# Patient Record
Sex: Male | Born: 1970 | Race: White | Hispanic: No | Marital: Married | Smoking: Never smoker
Health system: Southern US, Community
[De-identification: ages and names within clinical notes are randomized; demographics above are authoritative.]

## PROBLEM LIST (undated history)

## (undated) DIAGNOSIS — I499 Cardiac arrhythmia, unspecified: Secondary | ICD-10-CM

## (undated) DIAGNOSIS — R5383 Other fatigue: Secondary | ICD-10-CM

## (undated) DIAGNOSIS — R5381 Other malaise: Secondary | ICD-10-CM

## (undated) DIAGNOSIS — Q249 Congenital malformation of heart, unspecified: Secondary | ICD-10-CM

## (undated) DIAGNOSIS — M5380 Other specified dorsopathies, site unspecified: Secondary | ICD-10-CM

## (undated) DIAGNOSIS — R42 Dizziness and giddiness: Secondary | ICD-10-CM

## (undated) HISTORY — DX: Dizziness and giddiness: R42

## (undated) HISTORY — DX: Congenital malformation of heart, unspecified: Q24.9

## (undated) HISTORY — DX: Other specified dorsopathies, site unspecified: M53.80

## (undated) HISTORY — DX: Other malaise: R53.81

## (undated) HISTORY — DX: Other fatigue: R53.83

## (undated) HISTORY — DX: Cardiac arrhythmia, unspecified: I49.9

---

## 2010-11-20 ENCOUNTER — Ambulatory Visit: Payer: Self-pay | Admitting: Family Medicine

## 2011-07-22 ENCOUNTER — Ambulatory Visit: Payer: Self-pay | Admitting: Internal Medicine

## 2013-03-04 ENCOUNTER — Ambulatory Visit: Payer: Self-pay | Admitting: Emergency Medicine

## 2013-03-04 LAB — URINALYSIS, COMPLETE
Blood: NEGATIVE
Glucose,UR: NEGATIVE mg/dL (ref 0–75)
Ketone: NEGATIVE
Leukocyte Esterase: NEGATIVE
Nitrite: NEGATIVE
Protein: NEGATIVE
Specific Gravity: 1.01 (ref 1.003–1.030)
WBC UR: NONE SEEN /HPF (ref 0–5)

## 2013-12-19 ENCOUNTER — Ambulatory Visit: Payer: Self-pay

## 2013-12-19 LAB — CBC WITH DIFFERENTIAL/PLATELET
BASOS ABS: 0.1 10*3/uL (ref 0.0–0.1)
Basophil %: 0.9 %
Eosinophil #: 0.1 10*3/uL (ref 0.0–0.7)
Eosinophil %: 1.4 %
HCT: 41.4 % (ref 40.0–52.0)
HGB: 14.2 g/dL (ref 13.0–18.0)
Lymphocyte #: 2.4 10*3/uL (ref 1.0–3.6)
Lymphocyte %: 31 %
MCH: 29.2 pg (ref 26.0–34.0)
MCHC: 34.3 g/dL (ref 32.0–36.0)
MCV: 85 fL (ref 80–100)
Monocyte #: 0.6 x10 3/mm (ref 0.2–1.0)
Monocyte %: 8.5 %
NEUTROS PCT: 58.2 %
Neutrophil #: 4.4 10*3/uL (ref 1.4–6.5)
Platelet: 256 10*3/uL (ref 150–440)
RBC: 4.85 10*6/uL (ref 4.40–5.90)
RDW: 13 % (ref 11.5–14.5)
WBC: 7.6 10*3/uL (ref 3.8–10.6)

## 2013-12-19 LAB — COMPREHENSIVE METABOLIC PANEL
ALBUMIN: 3.7 g/dL (ref 3.4–5.0)
ALT: 26 U/L
Alkaline Phosphatase: 47 U/L
Anion Gap: 7 (ref 7–16)
BUN: 17 mg/dL (ref 7–18)
Bilirubin,Total: 0.6 mg/dL (ref 0.2–1.0)
Calcium, Total: 8.9 mg/dL (ref 8.5–10.1)
Chloride: 104 mmol/L (ref 98–107)
Co2: 30 mmol/L (ref 21–32)
Creatinine: 1.04 mg/dL (ref 0.60–1.30)
Glucose: 79 mg/dL (ref 65–99)
Osmolality: 282 (ref 275–301)
POTASSIUM: 3.9 mmol/L (ref 3.5–5.1)
SGOT(AST): 16 U/L (ref 15–37)
SODIUM: 141 mmol/L (ref 136–145)
Total Protein: 7.2 g/dL (ref 6.4–8.2)

## 2013-12-19 LAB — MAGNESIUM: MAGNESIUM: 2 mg/dL

## 2013-12-19 LAB — TSH: THYROID STIMULATING HORM: 1.52 u[IU]/mL

## 2013-12-23 ENCOUNTER — Encounter: Payer: Self-pay | Admitting: *Deleted

## 2013-12-30 ENCOUNTER — Encounter: Payer: Self-pay | Admitting: *Deleted

## 2014-01-02 ENCOUNTER — Encounter: Payer: Self-pay | Admitting: Cardiovascular Disease

## 2014-01-02 ENCOUNTER — Encounter (INDEPENDENT_AMBULATORY_CARE_PROVIDER_SITE_OTHER): Payer: Self-pay

## 2014-01-02 ENCOUNTER — Ambulatory Visit (INDEPENDENT_AMBULATORY_CARE_PROVIDER_SITE_OTHER): Payer: BC Managed Care – PPO | Admitting: Cardiovascular Disease

## 2014-01-02 VITALS — BP 122/82 | HR 73 | Ht 68.0 in | Wt 180.5 lb

## 2014-01-02 DIAGNOSIS — R002 Palpitations: Secondary | ICD-10-CM | POA: Diagnosis not present

## 2014-01-02 NOTE — Patient Instructions (Signed)
Your physician has recommended that you wear a holter monitor. Holter monitors are medical devices that record the heart's electrical activity. Doctors most often use these monitors to diagnose arrhythmias. Arrhythmias are problems with the speed or rhythm of the heartbeat. The monitor is a small, portable device. You can wear one while you do your normal daily activities. This is usually used to diagnose what is causing palpitations/syncope (passing out).   Your physician recommends that you schedule a follow-up appointment in:  As needed    

## 2014-01-02 NOTE — Progress Notes (Signed)
  Primary care physician: Dr. Juanetta GoslingHawkins  HPI  This is a 43 year old man who was referred for evaluation of palpitations. He has no previous cardiac history. He has no significant chronic medical conditions. There is no family history of coronary artery disease or arrhythmia. He does not smoke. He consumes only one to 2 cups of coffee a day. He reports prolonged history of almost daily palpitations which got worse one week ago. He went to urgent care for that reason. He feels forceful heartbeats with fluttering. There has been no chest pain, shortness of breath, syncope or presyncope. He occasionally has very mild dizziness with the symptoms. His labs at the urgent care were unremarkable including normal thyroid function. ECG showed no acute abnormalities.  No Known Allergies   No current outpatient prescriptions on file prior to visit.   No current facility-administered medications on file prior to visit.     Past Medical History  Diagnosis Date  . Cardiac dysrhythmia, unspecified   . Unspecified congenital anomaly of heart   . Other malaise and fatigue   . Dizziness and giddiness   . Other symptoms referable to back      History reviewed. No pertinent past surgical history.   Family History  Problem Relation Age of Onset  . Hyperlipidemia Mother   . Hyperlipidemia Father      History   Social History  . Marital Status: Married    Spouse Name: N/A    Number of Children: N/A  . Years of Education: N/A   Occupational History  . Not on file.   Social History Main Topics  . Smoking status: Never Smoker   . Smokeless tobacco: Not on file  . Alcohol Use: No  . Drug Use: No  . Sexual Activity: Not on file   Other Topics Concern  . Not on file   Social History Narrative  . No narrative on file     ROS A 10 point review of system was performed. It is negative other than that mentioned in the history of present illness.   PHYSICAL EXAM   BP 122/82  Pulse 73   Ht 5\' 8"  (1.727 m)  Wt 180 lb 8 oz (81.874 kg)  BMI 27.45 kg/m2 Constitutional: He is oriented to person, place, and time. He appears well-developed and well-nourished. No distress.  HENT: No nasal discharge.  Head: Normocephalic and atraumatic.  Eyes: Pupils are equal and round.  No discharge. Neck: Normal range of motion. Neck supple. No JVD present. No thyromegaly present.  Cardiovascular: Normal rate, regular rhythm, normal heart sounds. Exam reveals no gallop and no friction rub. No murmur heard.  Pulmonary/Chest: Effort normal and breath sounds normal. No stridor. No respiratory distress. He has no wheezes. He has no rales. He exhibits no tenderness.  Abdominal: Soft. Bowel sounds are normal. He exhibits no distension. There is no tenderness. There is no rebound and no guarding.  Musculoskeletal: Normal range of motion. He exhibits no edema and no tenderness.  Neurological: He is alert and oriented to person, place, and time. Coordination normal.  Skin: Skin is warm and dry. No rash noted. He is not diaphoretic. No erythema. No pallor.  Psychiatric: He has a normal mood and affect. His behavior is normal. Judgment and thought content normal.       WUJ:[id number]:Sinus  Rhythm  WITHIN NORMAL LIMITS   ASSESSMENT AND PLAN

## 2014-01-03 NOTE — Assessment & Plan Note (Signed)
These are likely due to premature beats. The symptoms are overall mild. Physical exam is unremarkable and baseline ECG is normal. There are no red flags for malignant arrhythmia. I recommend evaluation with a 48-hour Holter monitor. He is already cutting down on caffeine intake.

## 2014-01-27 ENCOUNTER — Telehealth: Payer: Self-pay | Admitting: *Deleted

## 2014-01-27 NOTE — Telephone Encounter (Signed)
Informed patient that per Dr. Kirke Corin holter showed: Normal sinus rhythm with sinus arrythmia  Rare pac's  No significant arrythmia's Patient verbalized understanding

## 2014-01-31 ENCOUNTER — Other Ambulatory Visit: Payer: Self-pay

## 2014-01-31 ENCOUNTER — Ambulatory Visit (INDEPENDENT_AMBULATORY_CARE_PROVIDER_SITE_OTHER): Payer: BC Managed Care – PPO

## 2014-01-31 DIAGNOSIS — R002 Palpitations: Secondary | ICD-10-CM

## 2014-10-15 DIAGNOSIS — I459 Conduction disorder, unspecified: Secondary | ICD-10-CM | POA: Insufficient documentation

## 2014-10-15 DIAGNOSIS — R5381 Other malaise: Secondary | ICD-10-CM | POA: Insufficient documentation

## 2014-10-15 DIAGNOSIS — R42 Dizziness and giddiness: Secondary | ICD-10-CM | POA: Insufficient documentation

## 2014-10-15 DIAGNOSIS — M62830 Muscle spasm of back: Secondary | ICD-10-CM | POA: Insufficient documentation

## 2014-10-15 DIAGNOSIS — Z8619 Personal history of other infectious and parasitic diseases: Secondary | ICD-10-CM | POA: Insufficient documentation

## 2014-10-15 DIAGNOSIS — I519 Heart disease, unspecified: Secondary | ICD-10-CM | POA: Insufficient documentation

## 2014-10-15 DIAGNOSIS — R5383 Other fatigue: Secondary | ICD-10-CM

## 2014-10-18 ENCOUNTER — Ambulatory Visit (INDEPENDENT_AMBULATORY_CARE_PROVIDER_SITE_OTHER): Payer: BLUE CROSS/BLUE SHIELD | Admitting: Family Medicine

## 2014-10-18 ENCOUNTER — Encounter: Payer: Self-pay | Admitting: Family Medicine

## 2014-10-18 VITALS — BP 129/83 | HR 67 | Temp 98.7°F | Resp 16 | Ht 69.0 in | Wt 171.8 lb

## 2014-10-18 DIAGNOSIS — Z0000 Encounter for general adult medical examination without abnormal findings: Secondary | ICD-10-CM | POA: Diagnosis not present

## 2014-10-18 DIAGNOSIS — Z23 Encounter for immunization: Secondary | ICD-10-CM | POA: Diagnosis not present

## 2014-10-18 NOTE — Addendum Note (Signed)
Addended by: Clayborne ArtistHOLDEN, JAMIE A on: 10/18/2014 04:09 PM   Modules accepted: Kipp BroodSmartSet

## 2014-10-18 NOTE — Patient Instructions (Signed)
Form for [employer] completed and signed.

## 2014-10-18 NOTE — Progress Notes (Signed)
Name: John Christensen   MRN: [id number]    DOB: 1971/03/09   Date:10/18/2014       Progress Note  Subjective  Chief Complaint  Chief Complaint  Patient presents with  . Annual Exam    HPI-Here for [place] physical exam.  HE has no major complaints.  No current medical problems of significance.   No problem-specific assessment & plan notes found for this encounter.   Past Medical History  Diagnosis Date  . Cardiac dysrhythmia, unspecified   . Unspecified congenital anomaly of heart   . Other malaise and fatigue   . Dizziness and giddiness   . Other symptoms referable to back     History reviewed. No pertinent past surgical history.  Family History  Problem Relation Age of Onset  . Hyperlipidemia Mother   . Hyperlipidemia Father     History   Social History  . Marital Status: Married    Spouse Name: N/A  . Number of Children: N/A  . Years of Education: N/A   Occupational History  . Not on file.   Social History Main Topics  . Smoking status: Never Smoker   . Smokeless tobacco: Never Used  . Alcohol Use: No  . Drug Use: No  . Sexual Activity: Not on file   Other Topics Concern  . Not on file   Social History Narrative    No current outpatient prescriptions on file.  No Known Allergies   Review of Systems  Constitutional: Negative.   HENT: Negative.   Eyes: Negative.   Respiratory: Negative.   Cardiovascular: Negative.  Negative for palpitations.  Gastrointestinal: Negative.   Genitourinary: Negative.   Musculoskeletal: Negative for back pain (on and off; minor).  Skin: Negative.   Neurological: Negative.   Endo/Heme/Allergies: Negative.  Negative for environmental allergies.  Psychiatric/Behavioral: Negative.       Objective  Filed Vitals:   10/18/14 1406  BP: 129/83  Pulse: 67  Temp: 98.7 F (37.1 C)  Resp: 16  Height: _0  (1.753 m)  Weight: 171 lb 12.8 oz (77.928 kg)    Physical Exam  Constitutional: He is  oriented to person, place, and time and well-developed, well-nourished, and in no distress. No distress.  HENT:  Head: Normocephalic and atraumatic.  Right Ear: External ear normal.  Left Ear: External ear normal.  Nose: Nose normal.  Mouth/Throat: Oropharynx is clear and moist.  Eyes: Conjunctivae are normal. Pupils are equal, round, and reactive to light. Right eye exhibits no discharge. Left eye exhibits no discharge. No scleral icterus.  Neck: Normal range of motion. Neck supple. No tracheal deviation present. No thyromegaly present.  Cardiovascular: Normal rate, regular rhythm, normal heart sounds and intact distal pulses.  Exam reveals no gallop and no friction rub.   No murmur heard. Pulmonary/Chest: Effort normal and breath sounds normal. He has no wheezes. He has no rales. He exhibits no tenderness.  Abdominal: Soft. Bowel sounds are normal. He exhibits no distension and no mass. There is no tenderness. There is no rebound and no guarding.  Genitourinary: Penis normal. No discharge found.  Musculoskeletal: Normal range of motion. He exhibits no edema or tenderness.  Lymphadenopathy:    He has no cervical adenopathy.  Neurological: He is alert and oriented to person, place, and time. He has normal reflexes. No cranial nerve deficit. Coordination normal.  Skin: Skin is warm and dry. No rash noted. No erythema. No pallor.  Psychiatric: Mood, affect and judgment normal.  Vitals  reviewed.      No results found for this or any previous visit (from the past 2160 hour(s)).   Assessment & Plan  Problem List Items Addressed This Visit    None    Visit Diagnoses    Preventative health care    -  Primary    Relevant Orders    CBC w/Diff    Comp Met (CMET)    Lipid Profile    Need for Tdap vaccination        Relevant Orders    Tdap vaccine greater than or equal to 7yo IM (Completed)       No orders of the defined types were placed in this encounter.    Form for [employer]  physical completed and signed.

## 2014-10-19 ENCOUNTER — Encounter: Payer: Self-pay | Admitting: Family Medicine

## 2014-10-19 LAB — COMPREHENSIVE METABOLIC PANEL
ALBUMIN: 4.9 g/dL (ref 3.5–5.5)
ALT: 23 IU/L (ref 0–44)
AST: 22 IU/L (ref 0–40)
Albumin/Globulin Ratio: 2 (ref 1.1–2.5)
Alkaline Phosphatase: 52 IU/L (ref 39–117)
BUN / CREAT RATIO: 14 (ref 9–20)
BUN: 15 mg/dL (ref 6–24)
Bilirubin Total: 0.9 mg/dL (ref 0.0–1.2)
CALCIUM: 9.9 mg/dL (ref 8.7–10.2)
CHLORIDE: 102 mmol/L (ref 97–108)
CO2: 25 mmol/L (ref 18–29)
Creatinine, Ser: 1.05 mg/dL (ref 0.76–1.27)
GFR calc Af Amer: 99 mL/min/{1.73_m2} (ref >59)
GFR calc non Af Amer: 85 mL/min/{1.73_m2} (ref >59)
GLOBULIN, TOTAL: 2.5 g/dL (ref 1.5–4.5)
Glucose: 75 mg/dL (ref 65–99)
POTASSIUM: 4.3 mmol/L (ref 3.5–5.2)
Sodium: 143 mmol/L (ref 134–144)
TOTAL PROTEIN: 7.4 g/dL (ref 6.0–8.5)

## 2014-10-19 LAB — CBC WITH DIFFERENTIAL/PLATELET
BASOS: 1 %
Basophils Absolute: 0.1 10*3/uL (ref 0.0–0.2)
EOS (ABSOLUTE): 0.1 10*3/uL (ref 0.0–0.4)
EOS: 1 %
HEMATOCRIT: 44 % (ref 37.5–51.0)
Hemoglobin: 15.1 g/dL (ref 12.6–17.7)
IMMATURE GRANULOCYTES: 0 %
Immature Grans (Abs): 0 10*3/uL (ref 0.0–0.1)
Lymphocytes Absolute: 2.4 10*3/uL (ref 0.7–3.1)
Lymphs: 28 %
MCH: 28.9 pg (ref 26.6–33.0)
MCHC: 34.3 g/dL (ref 31.5–35.7)
MCV: 84 fL (ref 79–97)
MONOCYTES: 8 %
Monocytes Absolute: 0.7 10*3/uL (ref 0.1–0.9)
Neutrophils Absolute: 5.4 10*3/uL (ref 1.4–7.0)
Neutrophils: 62 %
Platelets: 308 10*3/uL (ref 150–379)
RBC: 5.23 x10E6/uL (ref 4.14–5.80)
RDW: 13.2 % (ref 12.3–15.4)
WBC: 8.6 10*3/uL (ref 3.4–10.8)

## 2014-10-19 LAB — LIPID PANEL
CHOL/HDL RATIO: 2.9 ratio (ref 0.0–5.0)
CHOLESTEROL TOTAL: 174 mg/dL (ref 100–199)
HDL: 59 mg/dL (ref >39)
LDL Calculated: 105 mg/dL — ABNORMAL HIGH (ref 0–99)
TRIGLYCERIDES: 51 mg/dL (ref 0–149)
VLDL Cholesterol Cal: 10 mg/dL (ref 5–40)

## 2014-10-19 NOTE — Telephone Encounter (Signed)
This encounter was created in error - please disregard.

## 2015-03-28 ENCOUNTER — Ambulatory Visit (INDEPENDENT_AMBULATORY_CARE_PROVIDER_SITE_OTHER): Payer: BLUE CROSS/BLUE SHIELD

## 2015-03-28 ENCOUNTER — Encounter: Payer: Self-pay | Admitting: Emergency Medicine

## 2015-03-28 ENCOUNTER — Ambulatory Visit
Admission: EM | Admit: 2015-03-28 | Discharge: 2015-03-28 | Disposition: A | Discharge status: Home / Self Care | Payer: BLUE CROSS/BLUE SHIELD | Attending: Family Medicine | Admitting: Family Medicine

## 2015-03-28 DIAGNOSIS — S8262XA Displaced fracture of lateral malleolus of left fibula, initial encounter for closed fracture: Secondary | ICD-10-CM | POA: Diagnosis not present

## 2015-03-28 NOTE — ED Provider Notes (Signed)
CSN: [id number]     Arrival date & time 03/28/15  0831 History   First MD Initiated Contact with Patient 03/28/15 [phone]     Chief Complaint  Patient presents with  . 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 He was [place] [facility] [place] diagnosis with a sprain x-rays reportedly showed no fracture is given an Ace wrap and told to use crutches that he had at home. Out of work until Monday but his boss sent him home because he was limping so badly. He states that the pain has persisted in some kind somewhat worse along with the swelling and bruising that he is has around his inferior ankle.  Past Medical History  Diagnosis Date  . Cardiac dysrhythmia, unspecified   . Unspecified congenital anomaly of heart   . Other malaise and fatigue   . Dizziness and giddiness   . Other symptoms referable to back    History reviewed. No pertinent past surgical history. Family History  Problem Relation Age of Onset  . Hyperlipidemia Mother   . Hyperlipidemia Father    Social History  Substance Use Topics  . Smoking status: Never Smoker   . Smokeless tobacco: Never Used  . Alcohol Use: No    Review of Systems  Constitutional: Positive for activity change. Negative for fever, chills and fatigue.  Musculoskeletal: Positive for joint swelling, arthralgias and gait problem.  Skin: Positive for color change.  All other systems reviewed and are negative.   Allergies  Review of patient's allergies indicates no known allergies.  Home Medications   Prior to Admission medications   Not on File   Meds Ordered and Administered this Visit  Medications - No data to display  BP 117/70 mmHg  Pulse 66  Temp(Src) 97.8 F (36.6 C) (Tympanic)  Resp 20  Ht 5' 8.5" (1.74 m)  Wt 170  lb (77.111 kg)  BMI 25.47 kg/m2  SpO2 100% No data found.   Physical Exam  Constitutional: He is oriented to person, place, and time. He appears well-developed and well-nourished. No distress.  HENT:  Head: Normocephalic and atraumatic.  Eyes: Pupils are equal, round, and reactive to light.  Neck: Neck supple.  Musculoskeletal: He exhibits edema and tenderness.  Admission of the left ankle shows limited range of motion to flexion extension but subtalar motion is preserved. Swelling mostly laterally with ecchymosis inferiorly along the plantar surface. He has no medial tenderness and most of his tenderness seems to be confined to the distal fibula lateral malleolus. He doesn't have much ligamentous pain he does have some slight tenderness over the base of the fifth metatarsal but this is not nearly as bad as the distal fibula.  Neurological: He is alert and oriented to person, place, and time.  Skin: Skin is warm and dry. He is not diaphoretic.  Psychiatric: He has a normal mood and affect. His behavior is normal. Judgment and thought content normal.  Nursing note and vitals reviewed.   ED Course  Procedures (including critical care time)  Labs Review Labs Reviewed - No data to display  Imaging Review Dg Ankle Complete Left  03/28/2015  CLINICAL DATA:  Stepped in hole twisting ankle with pain laterally EXAM: LEFT ANKLE COMPLETE - 3+ VIEW COMPARISON:  None. FINDINGS: There is a tiny avulsion fracture fragment  from the tip of the distal left fibula with adjacent soft tissue swelling. The ankle joint appears normal. Alignment is normal. IMPRESSION: Small avulsion fracture fragment from the tip of the distal left fibula. Electronically Signed   By: Dwyane Dee M.D.   On: 03/28/2015 10:25     Visual Acuity Review  Right Eye Distance:   Left Eye Distance:   Bilateral Distance:    Right Eye Near:   Left Eye Near:    Bilateral Near:         MDM   1. Avulsion fracture of lateral  malleolus of left fibula, closed, initial encounter    New Prescriptions   No medications on file  Plan: 1. Test/x-ray results and diagnosis reviewed with patient 2. rx as per orders; risks, benefits, potential side effects reviewed with patient 3. Recommend supportive treatment with elevation/ice PRN. Boot applied here .May be out of boot for quiet periods.Crutches PRN. 4. F/u  Podiatry if not healing in 1-2 weeks.     Lutricia Feil, PA-C 03/28/15 1103

## 2015-03-28 NOTE — Discharge Instructions (Signed)
°Cast or Splint Care  ° ° °Casts and splints support injured limbs and keep bones from moving while they heal. It is important to care for your cast or splint at home.  °HOME CARE INSTRUCTIONS  °Keep the cast or splint uncovered during the drying period. It can take 24 to 48 hours to dry if it is made of plaster. A fiberglass cast will dry in less than 1 hour.  °Do not rest the cast on anything harder than a pillow for the first 24 hours.  °Do not put weight on your injured limb or apply pressure to the cast until your health care provider gives you permission.  °Keep the cast or splint dry. Wet casts or splints can lose their shape and may not support the limb as well. A wet cast that has lost its shape can also create harmful pressure on your skin when it dries. Also, wet skin can become infected.  °Cover the cast or splint with a plastic bag when bathing or when out in the rain or snow. If the cast is on the trunk of the body, take sponge baths until the cast is removed.  °If your cast does become wet, dry it with a towel or a blow dryer on the cool setting only. °Keep your cast or splint clean. Soiled casts may be wiped with a moistened cloth.  °Do not place any hard or soft foreign objects under your cast or splint, such as cotton, toilet paper, lotion, or powder.  °Do not try to scratch the skin under the cast with any object. The object could get stuck inside the cast. Also, scratching could lead to an infection. If itching is a problem, use a blow dryer on a cool setting to relieve discomfort.  °Do not trim or cut your cast or remove padding from inside of it.  °Exercise all joints next to the injury that are not immobilized by the cast or splint. For example, if you have a long leg cast, exercise the hip joint and toes. If you have an arm cast or splint, exercise the shoulder, elbow, thumb, and fingers.  °Elevate your injured arm or leg on 1 or 2 pillows for the first 1 to 3 days to decrease swelling and  pain. It is best if you can comfortably elevate your cast so it is higher than your heart. °SEEK MEDICAL CARE IF:  °Your cast or splint cracks.  °Your cast or splint is too tight or too loose.  °You have unbearable itching inside the cast.  °Your cast becomes wet or develops a soft spot or area.  °You have a bad smell coming from inside your cast.  °You get an object stuck under your cast.  °Your skin around the cast becomes red or raw.  °You have new pain or worsening pain after the cast has been applied. °SEEK IMMEDIATE MEDICAL CARE IF:  °You have fluid leaking through the cast.  °You are unable to move your fingers or toes.  °You have discolored (blue or white), cool, painful, or very swollen fingers or toes beyond the cast.  °You have tingling or numbness around the injured area.  °You have severe pain or pressure under the cast.  °You have any difficulty with your breathing or have shortness of breath.  °You have chest pain. °This information is not intended to replace advice given to you by your health care provider. Make sure you discuss any questions you have with your health care provider.  °  Document Released: 05/02/2000 Document Revised: 02/23/2013 Document Reviewed: 11/11/2012  °Elsevier Interactive Patient Education ©2016 [employer]  ° °

## 2015-03-28 NOTE — ED Notes (Signed)
Recheck left ankle.

## 2015-10-29 DIAGNOSIS — R0602 Shortness of breath: Secondary | ICD-10-CM | POA: Diagnosis not present

## 2015-11-19 DIAGNOSIS — R0789 Other chest pain: Secondary | ICD-10-CM | POA: Diagnosis not present

## 2015-11-19 DIAGNOSIS — R0609 Other forms of dyspnea: Secondary | ICD-10-CM | POA: Diagnosis not present

## 2015-11-19 DIAGNOSIS — R079 Chest pain, unspecified: Secondary | ICD-10-CM | POA: Diagnosis not present

## 2016-09-09 DIAGNOSIS — Z0000 Encounter for general adult medical examination without abnormal findings: Secondary | ICD-10-CM | POA: Diagnosis not present

## 2016-09-09 LAB — CBC WITH DIFFERENTIAL/PLATELET
Basophils Absolute: 52 cells/uL (ref 0–200)
Basophils Relative: 1 %
Eosinophils Absolute: 104 cells/uL (ref 15–500)
Eosinophils Relative: 2 %
HCT: 45.8 % (ref 38.5–50.0)
Hemoglobin: 15.7 g/dL (ref 13.2–17.1)
Lymphocytes Relative: 30 %
Lymphs Abs: 1560 cells/uL (ref 850–3900)
MCH: 29.1 pg (ref 27.0–33.0)
MCHC: 34.3 g/dL (ref 32.0–36.0)
MCV: 85 fL (ref 80.0–100.0)
MPV: 9.6 fL (ref 7.5–12.5)
Monocytes Absolute: 624 cells/uL (ref 200–950)
Monocytes Relative: 12 %
Neutro Abs: 2860 cells/uL (ref 1500–7800)
Neutrophils Relative %: 55 %
Platelets: 276 10*3/uL (ref 140–400)
RBC: 5.39 MIL/uL (ref 4.20–5.80)
RDW: 13.4 % (ref 11.0–15.0)
WBC: 5.2 10*3/uL (ref 3.8–10.8)

## 2016-09-09 LAB — TSH: TSH: 1.32 mIU/L (ref 0.40–4.50)

## 2016-09-10 LAB — LIPID PANEL
Cholesterol: 157 mg/dL (ref <200)
HDL: 63 mg/dL (ref >40)
LDL Cholesterol: 85 mg/dL (ref <100)
Total CHOL/HDL Ratio: 2.5 Ratio (ref <5.0)
Triglycerides: 47 mg/dL (ref <150)
VLDL: 9 mg/dL (ref <30)

## 2016-09-10 LAB — COMPREHENSIVE METABOLIC PANEL
ALT: 20 U/L (ref 9–46)
AST: 20 U/L (ref 10–40)
Albumin: 4.4 g/dL (ref 3.6–5.1)
Alkaline Phosphatase: 44 U/L (ref 40–115)
BUN: 19 mg/dL (ref 7–25)
CO2: 25 mmol/L (ref 20–31)
Calcium: 9.8 mg/dL (ref 8.6–10.3)
Chloride: 103 mmol/L (ref 98–110)
Creat: 1.22 mg/dL (ref 0.60–1.35)
Glucose, Bld: 81 mg/dL (ref 65–99)
Potassium: 4.3 mmol/L (ref 3.5–5.3)
Sodium: 139 mmol/L (ref 135–146)
Total Bilirubin: 1.4 mg/dL — ABNORMAL HIGH (ref 0.2–1.2)
Total Protein: 7.2 g/dL (ref 6.1–8.1)

## 2016-09-11 ENCOUNTER — Ambulatory Visit (INDEPENDENT_AMBULATORY_CARE_PROVIDER_SITE_OTHER): Payer: BLUE CROSS/BLUE SHIELD | Admitting: Nurse Practitioner

## 2016-09-11 ENCOUNTER — Other Ambulatory Visit: Payer: Self-pay | Admitting: Nurse Practitioner

## 2016-09-11 ENCOUNTER — Encounter: Payer: Self-pay | Admitting: Nurse Practitioner

## 2016-09-11 VITALS — BP 111/89 | HR 73 | Temp 98.4°F | Resp 16 | Ht 69.0 in | Wt 176.0 lb

## 2016-09-11 DIAGNOSIS — Z0000 Encounter for general adult medical examination without abnormal findings: Secondary | ICD-10-CM | POA: Diagnosis not present

## 2016-09-11 DIAGNOSIS — R0602 Shortness of breath: Secondary | ICD-10-CM | POA: Diagnosis not present

## 2016-09-11 DIAGNOSIS — Z87898 Personal history of other specified conditions: Secondary | ICD-10-CM | POA: Diagnosis not present

## 2016-09-11 LAB — HIV ANTIBODY (ROUTINE TESTING W REFLEX)

## 2016-09-11 NOTE — Assessment & Plan Note (Signed)
Currently stable symptoms with most recent occurrence in November.  Plan: 1. Monitor occurrences for any pattern or possible trigger. 2. Consider Singulair vs. Albuterol inhaler or spirometry to evaluate for asthma.

## 2016-09-11 NOTE — Patient Instructions (Signed)
John Christensen, Thank you for coming in to clinic today.  1. Your labs were normal without any concerns.  You can see these results on MyChart. 2. For your shortness of breath, monitor for any pattern of activity level or other triggers.  Let us know if you continue having episodes of shortness of breath with or without an identifiable trigger and we can see you in the office.  We can consider an allergy pill or other medication for known triggers or provide an inhaler for managing your symptoms.  Please schedule a follow-up appointment with Wilhelmina Mcardle, AGNP in  1 year for annual physical.  If you have any other questions or concerns, please feel free to call the clinic or send a message through MyChart. You may also schedule an earlier appointment if necessary.  Wilhelmina Mcardle, DNP, AGNP-BC Adult Gerontology Nurse Practitioner [facility], [facility]

## 2016-09-11 NOTE — Assessment & Plan Note (Signed)
No active palpitations  Plan:  1. Assess risk of cardiovascular co-morbidities with labs for TSH, CBC diff, CMP, and Lipid panel.

## 2016-09-11 NOTE — Progress Notes (Signed)
Subjective:    Patient ID: John Christensen, male    DOB: 19-May-1971, 46 y.o.   MRN: [id number]  John Christensen is a 46 y.o. male presenting on 09/11/2016 for Annual Exam (Patient here today for CPE and to have [employer] form filled out. Patient reports feeling fairly well. Reports exercising daily and sleeping failrly well. Patient reports that he has been having some shortness of breath on and off for several months. 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  Now occurs occasionally without pattern.  He feels like he can't get the air in and is gasping for breath (once with sex, swimming once, occasionally on the job, and once while outdoors on a hiking/camping trip in November. Since that trip he has also hiked 3 miles in/out [place] without incident.  There is no seeming pattern between activity level or environmental stimulus.   When it occurs it is always with strenuous activity, but does not occur every time the patient participates in strenuous activity.  Patient is able to slow his breathing and focus on moving air in and out and will recover within several minutes.  Health Maintenance Last eye:  More than 1 year less than 3 years. Last dental: about 1 year. 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  Diet: he has made some dietary adjustments after his last lipid panel.  He has changed the type of snack bar he eats and is now eating nut bars without a coating.  He eats fruit and sunflower seeds or other nuts for snacks regularly.  Past Medical History:  Diagnosis Date  . Cardiac dysrhythmia, unspecified   .  Dizziness and giddiness   . Other malaise and fatigue   . Other symptoms referable to back   . Unspecified congenital anomaly of heart    History reviewed. No pertinent surgical history. Social History   Social History  . Marital status: Married    Spouse name: N/A  . Number of children: N/A  . Years of education: N/A   Occupational History  . Not on file.   Social History Main Topics  . Smoking status: Never Smoker  . Smokeless tobacco: Never Used  . Alcohol use No  . Drug use: No  . Sexual activity: Not on file   Other Topics Concern  . Not on file   Social History Narrative  . No narrative on file   Family History  Problem Relation Age of Onset  . Hyperlipidemia Mother   . Hyperlipidemia Father    No current outpatient prescriptions on file prior to visit.   No current facility-administered medications on file prior to visit.     Review of Systems  Constitutional: Negative.   HENT: Negative.   Eyes: Negative.   Respiratory: Positive for shortness of breath. Negative for apnea, cough, choking, chest tightness, wheezing and stridor.   Cardiovascular: Negative.   Gastrointestinal: Negative.   Endocrine: Negative.   Genitourinary: Negative.   Musculoskeletal: Negative.   Skin: Negative.   Allergic/Immunologic: Negative.   Neurological: Negative.   Hematological: Negative.  Psychiatric/Behavioral: Negative.    Per HPI unless specifically indicated above     Objective:    BP 111/89 (BP Location: Left Arm, Patient Position: Sitting, Cuff Size: Large)   Pulse 73   Temp 98.4 F (36.9 C) (Oral)   Resp 16   Ht _0  (1.753 m)   Wt 176 lb (79.8 kg)   SpO2 98%   BMI 25.99 kg/m   Wt Readings from Last 3 Encounters:  09/11/16 176 lb (79.8 kg)  03/28/15 170 lb (77.1 kg)  10/18/14 171 lb 12.8 oz (77.9 kg)    Physical Exam  Constitutional: He is oriented to person, place, and time. He appears well-developed and well-nourished. No distress.  HENT:    Head: Normocephalic and atraumatic.  Right Ear: Hearing, tympanic membrane, external ear and ear canal normal.  Left Ear: Hearing, tympanic membrane, external ear and ear canal normal.  Nose: Nose normal.  Mouth/Throat: Oropharynx is clear and moist.  Eyes: Conjunctivae and EOM are normal. Pupils are equal, round, and reactive to light.  Neck: Normal range of motion. Neck supple. No JVD present. No tracheal deviation present. No thyromegaly present.  Cardiovascular: Normal rate, regular rhythm and intact distal pulses.  Exam reveals no gallop and no friction rub.   No murmur heard. Pulmonary/Chest: Effort normal and breath sounds normal. No respiratory distress.  Abdominal: Soft. Bowel sounds are normal. He exhibits no mass. There is no tenderness.  Musculoskeletal: Normal range of motion.  Lymphadenopathy:    He has no cervical adenopathy.  Neurological: He is alert and oriented to person, place, and time. He has normal reflexes.  Skin: Skin is warm and dry.  Psychiatric: He has a normal mood and affect. His behavior is normal. Judgment and thought content normal.   Results for orders placed or performed in visit on 09/11/16  TSH  Result Value Ref Range   TSH 1.32 0.40 - 4.50 mIU/L  CBC with Differential/Platelet  Result Value Ref Range   WBC 5.2 3.8 - 10.8 K/uL   RBC 5.39 4.20 - 5.80 MIL/uL   Hemoglobin 15.7 13.2 - 17.1 g/dL   HCT 45.8 38.5 - 50.0 %   MCV 85.0 80.0 - 100.0 fL   MCH 29.1 27.0 - 33.0 pg   MCHC 34.3 32.0 - 36.0 g/dL   RDW 13.4 11.0 - 15.0 %   Platelets 276 140 - 400 K/uL   MPV 9.6 7.5 - 12.5 fL   Neutro Abs 2,860 1,500 - 7,800 cells/uL   Lymphs Abs 1,560 850 - 3,900 cells/uL   Monocytes Absolute 624 200 - 950 cells/uL   Eosinophils Absolute 104 15 - 500 cells/uL   Basophils Absolute 52 0 - 200 cells/uL   Neutrophils Relative % 55 %   Lymphocytes Relative 30 %   Monocytes Relative 12 %   Eosinophils Relative 2 %   Basophils Relative 1 %   Smear Review  Criteria for review not met   Comprehensive metabolic panel  Result Value Ref Range   Sodium 139 135 - 146 mmol/L   Potassium 4.3 3.5 - 5.3 mmol/L   Chloride 103 98 - 110 mmol/L   CO2 25 20 - 31 mmol/L   Glucose, Bld 81 65 - 99 mg/dL   BUN 19 7 - 25 mg/dL   Creat 1.22 0.60 - 1.35 mg/dL   Total Bilirubin 1.4 (H) 0.2 - 1.2 mg/dL   Alkaline Phosphatase 44 40 - 115 U/L   AST 20 10 - 40 U/L  ALT 20 9 - 46 U/L   Total Protein 7.2 6.1 - 8.1 g/dL   Albumin 4.4 3.6 - 5.1 g/dL   Calcium 9.8 8.6 - 10.3 mg/dL  Lipid panel  Result Value Ref Range   Cholesterol 157 <200 mg/dL   Triglycerides 47 <150 mg/dL   HDL 63 >40 mg/dL   Total CHOL/HDL Ratio 2.5 <5.0 Ratio   VLDL 9 <30 mg/dL   LDL Cholesterol 85 <100 mg/dL  HIV antibody  Result Value Ref Range   HIV 1&2 Ab, 4th Generation refused       Assessment & Plan:   Problem List Items Addressed This Visit      Other   Exertional shortness of breath    Currently stable symptoms with most recent occurrence in November.  Plan: 1. Monitor occurrences for any pattern or possible trigger. 2. Consider Singulair vs. Albuterol inhaler or spirometry to evaluate for asthma.      History of palpitations    No active palpitations  Plan:  1. Assess risk of cardiovascular co-morbidities with labs for TSH, CBC w/ diff, CMP, and Lipid panel.     Relevant Orders   TSH (Completed)   CBC with Differential/Platelet (Completed)   Comprehensive metabolic panel (Completed)   Lipid panel (Completed)    Other Visit Diagnoses    Encounter for annual physical exam    -  Primary Patient with good knowledge of health and overall health literacy.  Works to stay healthy with healthy diet and maintaining physical activity.  Plan: 1. Screen for common diseases with labs for TSH, CBC w/ diff, CMP, and Lipid panel 2. Reviewed glycemic index and dietary fiber as ways to continue to make improvements to overall health and sustain lipid panel improvements.    Relevant Orders   TSH (Completed)   CBC with Differential/Platelet (Completed)   Comprehensive metabolic panel (Completed)   Lipid panel (Completed)        Follow up plan: Return in about 1 year (around 09/11/2017) for physical.  Cassell Smiles, DNP, AGPCNP-BC Adult Gerontology Primary Care Nurse Practitioner [facility] 09/11/2016, 4:34 PM

## 2016-09-12 NOTE — Progress Notes (Signed)
I have reviewed this encounter including the documentation in this note and/or discussed this patient with the provider, Wilhelmina Mcardle, AGPCNP-BC. I am certifying that I agree with the content of this note as supervising physician.  Saralyn Pilar, DO [facility] 09/12/2016, 12:13 AM

## 2017-06-16 DIAGNOSIS — M25511 Pain in right shoulder: Secondary | ICD-10-CM | POA: Diagnosis not present

## 2017-06-16 DIAGNOSIS — G8929 Other chronic pain: Secondary | ICD-10-CM | POA: Diagnosis not present

## 2017-06-16 DIAGNOSIS — M958 Other specified acquired deformities of musculoskeletal system: Secondary | ICD-10-CM | POA: Diagnosis not present

## 2017-06-24 DIAGNOSIS — M25511 Pain in right shoulder: Secondary | ICD-10-CM | POA: Diagnosis not present

## 2017-09-18 ENCOUNTER — Encounter: Payer: BLUE CROSS/BLUE SHIELD | Admitting: Nurse Practitioner

## 2017-09-25 ENCOUNTER — Ambulatory Visit (INDEPENDENT_AMBULATORY_CARE_PROVIDER_SITE_OTHER): Payer: BLUE CROSS/BLUE SHIELD | Admitting: Nurse Practitioner

## 2017-09-25 ENCOUNTER — Encounter: Payer: Self-pay | Admitting: Nurse Practitioner

## 2017-09-25 ENCOUNTER — Other Ambulatory Visit: Payer: Self-pay

## 2017-09-25 VITALS — BP 116/58 | HR 64 | Temp 98.2°F | Ht 67.5 in | Wt 174.8 lb

## 2017-09-25 DIAGNOSIS — Z0000 Encounter for general adult medical examination without abnormal findings: Secondary | ICD-10-CM | POA: Diagnosis not present

## 2017-09-25 DIAGNOSIS — Z1211 Encounter for screening for malignant neoplasm of colon: Secondary | ICD-10-CM

## 2017-09-25 DIAGNOSIS — W57XXXA Bitten or stung by nonvenomous insect and other nonvenomous arthropods, initial encounter: Secondary | ICD-10-CM | POA: Diagnosis not present

## 2017-09-25 NOTE — Patient Instructions (Addendum)
John Christensen,   Thank you for coming in to clinic today.  1. No new findings today.  Continue your physical activity until you are increasing your heart rate for 30 minutes on most days of the week.  Continue eating well.  Colon Cancer Screening: - For all adults age 49 and older, routine colon cancer screening is highly recommended. - Early detection of colon cancer is important, because often there are no warning signs or symptoms.  If colon cancer is found early, usually it can be cured. Advanced cancer is hard to treat.  - If you are not interested in Colonoscopy screening (if done and normal you could be cleared for 5 to 10 years until next due), then Cologuard is an excellent alternative for screening test for Colon Cancer. It is highly sensitive for detecting DNA of colon cancer from even the earliest stages. Also, there is NO bowel prep required. - If Cologuard is NEGATIVE, then it is good for 3 years before next due - If Cologuard is POSITIVE, then it is strongly advised to get a Colonoscopy, which allows the GI doctor to locate the source of the cancer or polyp (even very early stage) and treat it by removing it. ------------------------- If you would like to proceed with Cologuard (stool DNA test) - FIRST, call your insurance company and tell them you want to check cost of Cologuard tell them CPT Code [phone] (it may be completely covered with a small or no cost, OR max cost without any coverage is about $600). If you do NOT open the kit, and decide not to do the test, you will NOT be charged, you should contact the company to return the kit if you decide not to do the test. - If you want to proceed, you can notify us (office phone, [place], or at next visit) and we will order it for you. The test kit will be delivered to your house in about 1 week. Follow instructions to collect your stool sample.  You may call the company for any help or questions, 24/7 telephone support  at [phone].   Please schedule a follow-up appointment with Cassell Smiles, AGNP. Return in about 1 year (around 09/26/2018) for annual physical.  If you have any other questions or concerns, please feel free to call the clinic or send a message through [place]. You may also schedule an earlier appointment if necessary.  You will receive a survey after today's visit either digitally by e-mail or paper by [employer]. Your experiences and feedback matter to [place].  Please respond so we know how we are doing as we provide care for you.   Cassell Smiles, DNP, AGNP-BC Adult Gerontology Nurse Practitioner [place]

## 2017-09-25 NOTE — Progress Notes (Signed)
Subjective:    Patient ID: John Christensen, male    DOB: [birth year], 47 y.o.   MRN: [id number]  Jayesh Marbach is a 47 y.o. 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  They have no acute concerns today. Sleeps 7-8 hours per night uninterrupted. 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  Has continued being active without concerns. 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  Pain level 0 with full return of ROM and most strength.    [employer] scout camp medical clearance form is also requested to be completed indicating good physical fitness and general medical health.  HEALTH MAINTENANCE: Weight/BMI: stable Physical activity: active with and outside of work Diet: generally healthy Seatbelt: always Sunscreen: rarely Prostate exam/PSA: due today Colon Cancer Screen: requests to start today HIV/HEP C:  Optometry: yearly Dentistry: regular  VACCINES: Tetanus: up to date  Past Medical History:  Diagnosis Date  . Cardiac dysrhythmia, unspecified   . Dizziness and giddiness   . Other malaise and fatigue   . Other symptoms referable to back   . Unspecified congenital anomaly of heart    No past surgical history on file. Social History   Socioeconomic History  . Marital status: Married    Spouse name: Not on file  . Number of children: Not on file  . Years of education: Not on file  . Highest education level: Not on file  Occupational History  . Not on file  Social Needs  . Financial resource strain: Not on file  . Food insecurity:    Worry: Not on file    Inability: Not on file  . Transportation needs:    Medical: Not on file    Non-medical: Not on file  Tobacco Use  . Smoking status: Never Smoker  . Smokeless tobacco: Never Used  Substance and Sexual Activity  . Alcohol use: No  . Drug use: No  . Sexual activity: Not  on file  Lifestyle  . Physical activity:    Days per week: Not on file    Minutes per session: Not on file  . Stress: Not on file  Relationships  . Social connections:    Talks on phone: Not on file    Gets together: Not on file    Attends religious service: Not on file    Active member of club or organization: Not on file    Attends meetings of clubs or organizations: Not on file    Relationship status: Not on file  . Intimate partner violence:    Fear of current or ex partner: Not on file    Emotionally abused: Not on file    Physically abused: Not on file    Forced sexual activity: Not on file  Other Topics Concern  . Not on file  Social History Narrative  . Not on file   Family History  Problem Relation Age of Onset  . Hyperlipidemia Mother   . Hyperlipidemia Father    No current outpatient medications on file prior to visit.   No current facility-administered medications on file prior to visit.     Review of Systems Per HPI unless specifically indicated above     Objective:    BP (!) 116/58 (BP Location: Right Arm, Patient Position: Sitting, Cuff Size: Normal)   Pulse 64  Temp 98.2 F (36.8 C) (Oral)   Ht 5' 7.5" (1.715 m)   Wt 174 lb 12.8 oz (79.3 kg)   BMI 26.97 kg/m   Wt Readings from Last 3 Encounters:  09/25/17 174 lb 12.8 oz (79.3 kg)  09/11/16 176 lb (79.8 kg)  03/28/15 170 lb (77.1 kg)    Physical Exam  Constitutional: He is oriented to person, place, and time. He appears well-developed and well-nourished. No distress.  HENT:  Head: Normocephalic and atraumatic.  Right Ear: External ear normal.  Left Ear: External ear normal.  Nose: Nose normal.  Mouth/Throat: Oropharynx is clear and moist.  Eyes: Pupils are equal, round, and reactive to light. Conjunctivae are normal.  Neck: Normal range of motion. Neck supple. No JVD present. No tracheal deviation present. No thyromegaly present.  Cardiovascular: Normal rate, regular rhythm, normal heart  sounds and intact distal pulses. Exam reveals no gallop and no friction rub.  No murmur heard. Pulmonary/Chest: Effort normal and breath sounds normal. No respiratory distress.  Abdominal: Soft. Bowel sounds are normal. He exhibits no distension. There is no hepatosplenomegaly. There is no tenderness.  Genitourinary:  Genitourinary Comments: Genital and Rectal Exam chaperoned by Donnie Mesa, CMA Genital: No inguinal hernia or lymphadenopathy. Rectal/DRE: Normal external exam without hemorrhoids fissures or abnormality. DRE with palpation of non-enlarged prostate smooth symmetrical without nodule or tenderness.   Musculoskeletal: Normal range of motion.       Right shoulder: Normal. He exhibits normal range of motion, no tenderness, no swelling, no deformity and no pain.  Lymphadenopathy:    He has no cervical adenopathy.  Neurological: He is alert and oriented to person, place, and time. No cranial nerve deficit.  Skin: Skin is warm and dry.  Psychiatric: He has a normal mood and affect. His behavior is normal. Judgment and thought content normal.  Nursing note and vitals reviewed.   Results for orders placed or performed in visit on 09/11/16  TSH  Result Value Ref Range   TSH 1.32 0.40 - 4.50 mIU/L  CBC with Differential/Platelet  Result Value Ref Range   WBC 5.2 3.8 - 10.8 K/uL   RBC 5.39 4.20 - 5.80 MIL/uL   Hemoglobin 15.7 13.2 - 17.1 g/dL   HCT 45.8 38.5 - 50.0 %   MCV 85.0 80.0 - 100.0 fL   MCH 29.1 27.0 - 33.0 pg   MCHC 34.3 32.0 - 36.0 g/dL   RDW 13.4 11.0 - 15.0 %   Platelets 276 140 - 400 K/uL   MPV 9.6 7.5 - 12.5 fL   Neutro Abs 2,860 1,500 - 7,800 cells/uL   Lymphs Abs 1,560 850 - 3,900 cells/uL   Monocytes Absolute 624 200 - 950 cells/uL   Eosinophils Absolute 104 15 - 500 cells/uL   Basophils Absolute 52 0 - 200 cells/uL   Neutrophils Relative % 55 %   Lymphocytes Relative 30 %   Monocytes Relative 12 %   Eosinophils Relative 2 %   Basophils Relative 1 %    Smear Review Criteria for review not met   Comprehensive metabolic panel  Result Value Ref Range   Sodium 139 135 - 146 mmol/L   Potassium 4.3 3.5 - 5.3 mmol/L   Chloride 103 98 - 110 mmol/L   CO2 25 20 - 31 mmol/L   Glucose, Bld 81 65 - 99 mg/dL   BUN 19 7 - 25 mg/dL   Creat 1.22 0.60 - 1.35 mg/dL   Total Bilirubin 1.4 (H) 0.2 - 1.2  mg/dL   Alkaline Phosphatase 44 40 - 115 U/L   AST 20 10 - 40 U/L   ALT 20 9 - 46 U/L   Total Protein 7.2 6.1 - 8.1 g/dL   Albumin 4.4 3.6 - 5.1 g/dL   Calcium 9.8 8.6 - 10.3 mg/dL  Lipid panel  Result Value Ref Range   Cholesterol 157 <200 mg/dL   Triglycerides 47 <150 mg/dL   HDL 63 >40 mg/dL   Total CHOL/HDL Ratio 2.5 <5.0 Ratio   VLDL 9 <30 mg/dL   LDL Cholesterol 85 <100 mg/dL  HIV antibody  Result Value Ref Range   HIV 1&2 Ab, 4th Generation refused       Assessment & Plan:   Problem List Items Addressed This Visit    None    Visit Diagnoses    Tick bite, initial encounter     Pt with regular exposure to ticks and finds ticks attached despite maximal efforts to prevent tick bites. - Screen latent lyme disease with antibody testing today.   Relevant Orders   B. [place]dorfi Antibodies   Encounter for annual physical exam    -  Primary Physical exam with no new findings.  Well adult with no acute concerns.  Plan: 1. Obtain health maintenance screenings as above according to age. - Increase physical activity to 30 minutes most days of the week.  - Eat healthy diet high in vegetables and fruits; low in refined carbohydrates. 2. Return 1 year for annual physical.   Relevant Orders   Cologuard   PSA   TSH   Lipid panel   Hemoglobin A1c   CBC with Differential/Platelet   COMPLETE METABOLIC PANEL WITH GFR   Colon cancer screening     Pt requiring colon cancer screening.  No significant family history of colon cancer.  Plan: - Discussed timing for initiation of colon cancer screening ACS vs USPSTF guidelines - Mutual decision  making discussion for options of colonoscopy vs cologuard.  Pt prefers cologuard. - Ordered Cologuard today   Relevant Orders   Cologuard       Follow up plan: Return in about 1 year (around 09/26/2018) for annual physical.  Cassell Smiles, DNP, AGPCNP-BC Adult Gerontology Primary Care Nurse Practitioner [facility] 09/25/2017, 1:21 PM

## 2017-09-28 LAB — CBC WITH DIFFERENTIAL/PLATELET
Basophils Absolute: 52 cells/uL (ref 0–200)
Basophils Relative: 0.9 %
Eosinophils Absolute: 70 cells/uL (ref 15–500)
Eosinophils Relative: 1.2 %
HCT: 44.5 % (ref 38.5–50.0)
Hemoglobin: 15.5 g/dL (ref 13.2–17.1)
Lymphs Abs: 1717 cells/uL (ref 850–3900)
MCH: 29.1 pg (ref 27.0–33.0)
MCHC: 34.8 g/dL (ref 32.0–36.0)
MCV: 83.5 fL (ref 80.0–100.0)
MPV: 9.8 fL (ref 7.5–12.5)
Monocytes Relative: 10 %
Neutro Abs: 3381 cells/uL (ref 1500–7800)
Neutrophils Relative %: 58.3 %
Platelets: 288 10*3/uL (ref 140–400)
RBC: 5.33 10*6/uL (ref 4.20–5.80)
RDW: 12.6 % (ref 11.0–15.0)
Total Lymphocyte: 29.6 %
WBC mixed population: 580 cells/uL (ref 200–950)
WBC: 5.8 10*3/uL (ref 3.8–10.8)

## 2017-09-28 LAB — LIPID PANEL
Cholesterol: 165 mg/dL (ref <200)
HDL: 54 mg/dL (ref >40)
LDL Cholesterol (Calc): 98 mg/dL (calc)
Non-HDL Cholesterol (Calc): 111 mg/dL (calc) (ref <130)
Total CHOL/HDL Ratio: 3.1 (calc) (ref <5.0)
Triglycerides: 51 mg/dL (ref <150)

## 2017-09-28 LAB — COMPLETE METABOLIC PANEL WITH GFR
AG Ratio: 1.6 (calc) (ref 1.0–2.5)
ALT: 15 U/L (ref 9–46)
AST: 16 U/L (ref 10–40)
Albumin: 4.4 g/dL (ref 3.6–5.1)
Alkaline phosphatase (APISO): 47 U/L (ref 40–115)
BUN: 16 mg/dL (ref 7–25)
CO2: 30 mmol/L (ref 20–32)
Calcium: 9.8 mg/dL (ref 8.6–10.3)
Chloride: 103 mmol/L (ref 98–110)
Creat: 1.02 mg/dL (ref 0.60–1.35)
GFR, Est African American: 100 mL/min/{1.73_m2} (ref >=60)
GFR, Est Non African American: 87 mL/min/{1.73_m2} (ref >=60)
Globulin: 2.7 g/dL (calc) (ref 1.9–3.7)
Glucose, Bld: 94 mg/dL (ref 65–99)
Potassium: 4.8 mmol/L (ref 3.5–5.3)
Sodium: 139 mmol/L (ref 135–146)
Total Bilirubin: 1.1 mg/dL (ref 0.2–1.2)
Total Protein: 7.1 g/dL (ref 6.1–8.1)

## 2017-09-28 LAB — PSA: PSA: 0.4 ng/mL (ref <=4.0)

## 2017-09-28 LAB — HEMOGLOBIN A1C
Hgb A1c MFr Bld: 4.7 % of total Hgb (ref <5.7)
Mean Plasma Glucose: 88 (calc)
eAG (mmol/L): 4.9 (calc)

## 2017-09-28 LAB — B. BURGDORFI ANTIBODIES: B burgdorferi Ab IgG+IgM: <0.9 index

## 2017-09-28 LAB — TSH: TSH: 1.22 mIU/L (ref 0.40–4.50)

## 2017-10-16 ENCOUNTER — Encounter: Payer: Self-pay | Admitting: Nurse Practitioner

## 2019-12-28 ENCOUNTER — Ambulatory Visit (INDEPENDENT_AMBULATORY_CARE_PROVIDER_SITE_OTHER): Payer: BLUE CROSS/BLUE SHIELD | Admitting: Family Medicine

## 2019-12-28 ENCOUNTER — Other Ambulatory Visit: Payer: Self-pay

## 2019-12-28 ENCOUNTER — Encounter: Payer: Self-pay | Admitting: Family Medicine

## 2019-12-28 VITALS — BP 122/76 | HR 66 | Temp 97.5°F | Resp 16 | Ht 69.0 in | Wt 179.0 lb

## 2019-12-28 DIAGNOSIS — Z6826 Body mass index (BMI) 26.0-26.9, adult: Secondary | ICD-10-CM

## 2019-12-28 DIAGNOSIS — Z125 Encounter for screening for malignant neoplasm of prostate: Secondary | ICD-10-CM | POA: Diagnosis not present

## 2019-12-28 DIAGNOSIS — Z1159 Encounter for screening for other viral diseases: Secondary | ICD-10-CM | POA: Diagnosis not present

## 2019-12-28 DIAGNOSIS — Z0000 Encounter for general adult medical examination without abnormal findings: Secondary | ICD-10-CM

## 2019-12-28 DIAGNOSIS — Z1211 Encounter for screening for malignant neoplasm of colon: Secondary | ICD-10-CM

## 2019-12-28 LAB — POCT GLYCOSYLATED HEMOGLOBIN (HGB A1C): Hemoglobin A1C: 4.5 % (ref 4.0–5.6)

## 2019-12-28 NOTE — Patient Instructions (Addendum)
Thank you for coming to the office today.  Labs for prostate, chemistry, blood count, lipid, hep c  They will call you   [facility]) [place], [place] [address] Phone: [phone]  Colon Cancer Screening: - For all adults age 51+ routine colon cancer screening is highly recommended.     - Recent guidelines from [place] recommend starting age of 92 - Early detection of colon cancer is important, because often there are no warning signs or symptoms, also if found early usually it can be cured. Late stage is hard to treat.  - If you are not interested in Colonoscopy screening (if done and normal you could be cleared for 5 to 10 years until next due), then Cologuard is an excellent alternative for screening test for Colon Cancer. It is highly sensitive for detecting DNA of colon cancer from even the earliest stages. Also, there is NO bowel prep required. - If Cologuard is NEGATIVE, then it is good for 3 years before next due - If Cologuard is POSITIVE, then it is strongly advised to get a Colonoscopy, which allows the GI doctor to locate the source of the cancer or polyp (even very early stage) and treat it by removing it.  ------------------------- If you would like to proceed with Cologuard (stool DNA test) - FIRST, call your insurance company and tell them you want to check cost of Cologuard tell them CPT Code [phone] (it may be completely covered and you could get for no cost, OR max cost without any coverage is about $600). Also, keep in mind if you do NOT open the kit, and decide not to do the test, you will NOT be charged, you should contact the company if you decide not to do the test. - If you want to proceed, you can notify us (phone message, [place], or at next visit) and we will order it for you. The test kit will be delivered to you house within about 1 week. Follow instructions to collect sample, you may  call the company for any help or questions, 24/7 telephone support at [phone].    Please schedule a Follow-up Appointment to: Return in about 1 year (around 12/27/2020) for Follow-up 1 year for Annual Physical (fasting lab in AM after apt).  If you have any other questions or concerns, please feel free to call the office or send a message through [place]. You may also schedule an earlier appointment if necessary.  Additionally, you may be receiving a survey about your experience at our office within a few days to 1 week by e-mail or mail. We value your feedback.  Nobie Putnam, DO [place]

## 2019-12-28 NOTE — Progress Notes (Signed)
Subjective:    Patient ID: John Christensen, male    DOB: March 20, 1971, 49 y.o.   MRN: [id number]  John Christensen is a 49 y.o. male presenting on 12/28/2019 for Annual Exam  Here to establish with new PCP / Annual Physical  HPI   Here for Annual Physical and Due for lab.  Lifestyle BMI >26 Reviewed healthy lifestyle and diet / exercise goals He is doing well overall Not on medications or supplements  Additional complaint Admits a dull numbing pain R lower quadrant episodic seems to be on both sides, not related to diet or eating or drinking, and not provoked with lifting or other injury. No history of abdominal surgery in past. Admits regular BMs.  Health Maintenance:  Due for Hep C screening. Will get lab today.  Prostate CA Screening: Last PSA 0.4 (2019). Currently asymptomatic. No known family history of prostate CA. Due for screening.  Colon CA Screening: Never had colonoscopy or colon cancer screening. Currently asymptomatic. No known family history of colon CA. Due for screening test new referral to GI for colonoscopy    Depression screen [facility] 2/9 12/28/2019 09/11/2016 10/18/2014  Decreased Interest 0 0 0  Down, Depressed, Hopeless 0 0 0  PHQ - 2 Score 0 0 0  Altered sleeping - 1 -  Tired, decreased energy - 1 -  Change in appetite - 0 -  Feeling bad or failure about yourself  - 0 -  Trouble concentrating - 0 -  Moving slowly or fidgety/restless - 0 -  Suicidal thoughts - 0 -  PHQ-9 Score - 2 -  Difficult doing work/chores - Not difficult at all -    Past Medical History:  Diagnosis Date  . Cardiac dysrhythmia, unspecified   . Other malaise and fatigue   . Unspecified congenital anomaly of heart    No past surgical history on file. Social History   Socioeconomic History  . Marital status: Married    Spouse name: Not on file  . Number of children: Not on file  . Years of education: Not on file  . Highest education level: Not on file  Occupational  History  . Not on file  Tobacco Use  . Smoking status: Never Smoker  . Smokeless tobacco: Never Used  Vaping Use  . Vaping Use: Never used  Substance and Sexual Activity  . Alcohol use: No  . Drug use: No  . Sexual activity: Not on file  Other Topics Concern  . Not on file  Social History Narrative  . Not on file   Social Determinants of Health   Financial Resource Strain:   . Difficulty of Paying Living Expenses:   Food Insecurity:   . Worried About Programme researcher, broadcasting/film/video in the Last Year:   . Barista in the Last Year:   Transportation Needs:   . Freight forwarder (Medical):   Marland Kitchen Lack of Transportation (Non-Medical):   Physical Activity:   . Days of Exercise per Week:   . Minutes of Exercise per Session:   Stress:   . Feeling of Stress :   Social Connections:   . Frequency of Communication with Friends and Family:   . Frequency of Social Gatherings with Friends and Family:   . Attends Religious Services:   . Active Member of Clubs or Organizations:   . Attends Banker Meetings:   Marland Kitchen Marital Status:   Intimate Partner Violence:   . Fear of Current or  Ex-Partner:   . Emotionally Abused:   Marland Kitchen Physically Abused:   . Sexually Abused:    Family History  Problem Relation Age of Onset  . Hyperlipidemia Mother   . Hyperlipidemia Father   . Colon cancer Neg Hx   . Prostate cancer Neg Hx    No current outpatient medications on file prior to visit.   No current facility-administered medications on file prior to visit.    Review of Systems  Constitutional: Negative for activity change, appetite change, chills, diaphoresis, fatigue and fever.  HENT: Negative for congestion and hearing loss.   Eyes: Negative for visual disturbance.  Respiratory: Negative for apnea, cough, chest tightness, shortness of breath and wheezing.   Cardiovascular: Negative for chest pain, palpitations and leg swelling.  Gastrointestinal: Positive for abdominal pain (none  actively). Negative for anal bleeding, blood in stool, constipation, diarrhea, nausea and vomiting.  Endocrine: Negative for cold intolerance.  Genitourinary: Negative for difficulty urinating, dysuria, frequency and hematuria.  Musculoskeletal: Negative for arthralgias, back pain and neck pain.  Skin: Negative for rash.  Allergic/Immunologic: Negative for environmental allergies.  Neurological: Negative for dizziness, weakness, light-headedness, numbness and headaches.  Hematological: Negative for adenopathy.  Psychiatric/Behavioral: Negative for behavioral problems, dysphoric mood and sleep disturbance. The patient is not nervous/anxious.    Per HPI unless specifically indicated above      Objective:    BP 122/76   Pulse 66   Temp (!) 97.5 F (36.4 C) (Temporal)   Resp 16   Ht 5\' 9"  (1.753 m)   Wt 179 lb (81.2 kg)   SpO2 100%   BMI 26.43 kg/m   Wt Readings from Last 3 Encounters:  12/28/19 179 lb (81.2 kg)  09/25/17 174 lb 12.8 oz (79.3 kg)  09/11/16 176 lb (79.8 kg)    Physical Exam Vitals and nursing note reviewed.  Constitutional:      General: He is not in acute distress.    Appearance: He is well-developed. He is not diaphoretic.     Comments: Well-appearing, comfortable, cooperative  HENT:     Head: Normocephalic and atraumatic.  Eyes:     General:        Right eye: No discharge.        Left eye: No discharge.     Conjunctiva/sclera: Conjunctivae normal.     Pupils: Pupils are equal, round, and reactive to light.  Neck:     Thyroid: No thyromegaly.  Cardiovascular:     Rate and Rhythm: Normal rate and regular rhythm.     Heart sounds: Normal heart sounds. No murmur heard.   Pulmonary:     Effort: Pulmonary effort is normal. No respiratory distress.     Breath sounds: Normal breath sounds. No wheezing or rales.  Abdominal:     General: Bowel sounds are normal. There is no distension.     Palpations: Abdomen is soft. There is no mass.     Tenderness:  There is no abdominal tenderness.  Musculoskeletal:        General: No tenderness. Normal range of motion.     Cervical back: Normal range of motion and neck supple.     Comments: Upper / Lower Extremities: - Normal muscle tone, strength bilateral upper extremities 5/5, lower extremities 5/5  Lymphadenopathy:     Cervical: No cervical adenopathy.  Skin:    General: Skin is warm and dry.     Findings: No erythema or rash.  Neurological:     Mental Status: He  is alert and oriented to person, place, and time.     Comments: Distal sensation intact to light touch all extremities  Psychiatric:        Behavior: Behavior normal.     Comments: Well groomed, good eye contact, normal speech and thoughts       Results for orders placed or performed in visit on 12/28/19  CBC with Differential/Platelet  Result Value Ref Range   WBC 4.9 3.8 - 10.8 Thousand/uL   RBC 5.36 4.20 - 5.80 Million/uL   Hemoglobin 16.0 13.2 - 17.1 g/dL   HCT 52.7 38 - 50 %   MCV 87.9 80.0 - 100.0 fL   MCH 29.9 27.0 - 33.0 pg   MCHC 34.0 32.0 - 36.0 g/dL   RDW 78.2 42.3 - 53.6 %   Platelets 271 140 - 400 Thousand/uL   MPV 9.5 7.5 - 12.5 fL   Neutro Abs 3,033 1,500 - 7,800 cells/uL   Lymphs Abs 1,362 850 - 3,900 cells/uL   Absolute Monocytes 417 200 - 950 cells/uL   Eosinophils Absolute 49 15 - 500 cells/uL   Basophils Absolute 39 0 - 200 cells/uL   Neutrophils Relative % 61.9 %   Total Lymphocyte 27.8 %   Monocytes Relative 8.5 %   Eosinophils Relative 1.0 %   Basophils Relative 0.8 %  COMPLETE METABOLIC PANEL WITH GFR  Result Value Ref Range   Glucose, Bld 97 65 - 99 mg/dL   BUN 16 7 - 25 mg/dL   Creat 1.44 3.15 - 4.00 mg/dL   GFR, Est Non African American 93 > OR = 60 mL/min/1.60m2   GFR, Est African American 108 > OR = 60 mL/min/1.60m2   BUN/Creatinine Ratio NOT APPLICABLE 6 - 22 (calc)   Sodium 141 135 - 146 mmol/L   Potassium 4.2 3.5 - 5.3 mmol/L   Chloride 105 98 - 110 mmol/L   CO2 29 20 - 32  mmol/L   Calcium 9.9 8.6 - 10.3 mg/dL   Total Protein 7.3 6.1 - 8.1 g/dL   Albumin 4.6 3.6 - 5.1 g/dL   Globulin 2.7 1.9 - 3.7 g/dL (calc)   AG Ratio 1.7 1.0 - 2.5 (calc)   Total Bilirubin 1.0 0.2 - 1.2 mg/dL   Alkaline phosphatase (APISO) 50 35 - 144 U/L   AST 16 10 - 35 U/L   ALT 16 9 - 46 U/L  Lipid panel  Result Value Ref Range   Cholesterol 184 <200 mg/dL   HDL 59 > OR = 40 mg/dL   Triglycerides 65 <867 mg/dL   LDL Cholesterol (Calc) 110 (H) mg/dL (calc)   Total CHOL/HDL Ratio 3.1 <5.0 (calc)   Non-HDL Cholesterol (Calc) 125 <130 mg/dL (calc)  PSA  Result Value Ref Range   PSA 0.4 < OR = 4.0 ng/mL  POCT HgB A1C  Result Value Ref Range   Hemoglobin A1C 4.5 4.0 - 5.6 %      Assessment & Plan:   Problem List Items Addressed This Visit    None    Visit Diagnoses    Annual physical exam    -  Primary   Relevant Orders   CBC with Differential/Platelet (Completed)   COMPLETE METABOLIC PANEL WITH GFR (Completed)   Lipid panel (Completed)   PSA (Completed)   POCT HgB A1C (Completed)   Screening for colon cancer       Relevant Orders   Ambulatory referral to Gastroenterology   Screening for prostate cancer  Relevant Orders   PSA (Completed)   Need for hepatitis C screening test       Relevant Orders   Hepatitis C antibody   BMI 26.0-26.9,adult         Updated Health Maintenance information Fasting labs ordered today, will review results. - Add PSA prostate CA screening - Add Hep C routine lab screening Encouraged improvement to lifestyle with diet and exercise Maintain healthy weight  Referral to [facility] for routine screening colonoscopy age 49   Orders Placed This Encounter  Procedures  . CBC with Differential/Platelet  . COMPLETE METABOLIC PANEL WITH GFR  . Lipid panel    Order Specific Question:   Has the patient fasted?    Answer:   Yes  . PSA  . Hepatitis C antibody  . Ambulatory referral to Gastroenterology    Referral Priority:   Routine     Referral Type:   Consultation    Referral Reason:   Specialty Services Required    Number of Visits Requested:   1  . POCT HgB A1C     No orders of the defined types were placed in this encounter.    Follow up plan: Return in about 1 year (around 12/27/2020) for Follow-up 1 year for Annual Physical (fasting lab in AM after apt).  Saralyn PilarAlexander Hilaria Titsworth, DO [facility] 12/28/2019, 8:54 AM

## 2019-12-29 ENCOUNTER — Encounter: Payer: Self-pay | Admitting: Family Medicine

## 2019-12-29 LAB — COMPLETE METABOLIC PANEL WITH GFR
AG Ratio: 1.7 (calc) (ref 1.0–2.5)
ALT: 16 U/L (ref 9–46)
AST: 16 U/L (ref 10–35)
Albumin: 4.6 g/dL (ref 3.6–5.1)
Alkaline phosphatase (APISO): 50 U/L (ref 35–144)
BUN: 16 mg/dL (ref 7–25)
CO2: 29 mmol/L (ref 20–32)
Calcium: 9.9 mg/dL (ref 8.6–10.3)
Chloride: 105 mmol/L (ref 98–110)
Creat: 0.95 mg/dL (ref 0.70–1.33)
GFR, Est African American: 108 mL/min/{1.73_m2} (ref >=60)
GFR, Est Non African American: 93 mL/min/{1.73_m2} (ref >=60)
Globulin: 2.7 g/dL (calc) (ref 1.9–3.7)
Glucose, Bld: 97 mg/dL (ref 65–99)
Potassium: 4.2 mmol/L (ref 3.5–5.3)
Sodium: 141 mmol/L (ref 135–146)
Total Bilirubin: 1 mg/dL (ref 0.2–1.2)
Total Protein: 7.3 g/dL (ref 6.1–8.1)

## 2019-12-29 LAB — CBC WITH DIFFERENTIAL/PLATELET
Absolute Monocytes: 417 cells/uL (ref 200–950)
Basophils Absolute: 39 cells/uL (ref 0–200)
Basophils Relative: 0.8 %
Eosinophils Absolute: 49 cells/uL (ref 15–500)
Eosinophils Relative: 1 %
HCT: 47.1 % (ref 38.5–50.0)
Hemoglobin: 16 g/dL (ref 13.2–17.1)
Lymphs Abs: 1362 cells/uL (ref 850–3900)
MCH: 29.9 pg (ref 27.0–33.0)
MCHC: 34 g/dL (ref 32.0–36.0)
MCV: 87.9 fL (ref 80.0–100.0)
MPV: 9.5 fL (ref 7.5–12.5)
Monocytes Relative: 8.5 %
Neutro Abs: 3033 cells/uL (ref 1500–7800)
Neutrophils Relative %: 61.9 %
Platelets: 271 10*3/uL (ref 140–400)
RBC: 5.36 10*6/uL (ref 4.20–5.80)
RDW: 12.6 % (ref 11.0–15.0)
Total Lymphocyte: 27.8 %
WBC: 4.9 10*3/uL (ref 3.8–10.8)

## 2019-12-29 LAB — HEPATITIS C ANTIBODY
Hepatitis C Ab: NONREACTIVE
SIGNAL TO CUT-OFF: 0.01 (ref <1.00)

## 2019-12-29 LAB — LIPID PANEL
Cholesterol: 184 mg/dL (ref <200)
HDL: 59 mg/dL (ref >=40)
LDL Cholesterol (Calc): 110 mg/dL (calc) — ABNORMAL HIGH
Non-HDL Cholesterol (Calc): 125 mg/dL (calc) (ref <130)
Total CHOL/HDL Ratio: 3.1 (calc) (ref <5.0)
Triglycerides: 65 mg/dL (ref <150)

## 2019-12-29 LAB — PSA: PSA: 0.4 ng/mL (ref <=4.0)

## 2020-01-05 ENCOUNTER — Telehealth (INDEPENDENT_AMBULATORY_CARE_PROVIDER_SITE_OTHER): Payer: Self-pay | Admitting: Gastroenterology

## 2020-01-05 ENCOUNTER — Other Ambulatory Visit: Payer: Self-pay

## 2020-01-05 DIAGNOSIS — Z1211 Encounter for screening for malignant neoplasm of colon: Secondary | ICD-10-CM

## 2020-01-05 NOTE — Progress Notes (Signed)
Gastroenterology Pre-Procedure Review  Request Date: Monday 01/30/20 Requesting Physician: Dr. Allegra Lai  PATIENT REVIEW QUESTIONS: The patient responded to the following health history questions as indicated:    1. Are you having any GI issues? no 2. Do you have a personal history of Polyps? no 3. Do you have a family history of Colon Cancer or Polyps? no 4. Diabetes Mellitus? no 5. Joint replacements in the past 12 months?no 6. Major health problems in the past 3 months?no 7. Any artificial heart valves, MVP, or defibrillator?no    MEDICATIONS & ALLERGIES:    Patient reports the following regarding taking any anticoagulation/antiplatelet therapy:   Plavix, Coumadin, Eliquis, Xarelto, Lovenox, Pradaxa, Brilinta, or Effient? no Aspirin? no  Patient confirms/reports the following medications:  No current outpatient medications on file.   No current facility-administered medications for this visit.    Patient confirms/reports the following allergies:  No Known Allergies  No orders of the defined types were placed in this encounter.   AUTHORIZATION INFORMATION Primary Insurance: 1D#: Group #:  Secondary Insurance: 1D#: Group #:  SCHEDULE INFORMATION: Date: 01/30/20 Time: Location:[facility]

## 2020-01-26 ENCOUNTER — Other Ambulatory Visit
Admission: RE | Admit: 2020-01-26 | Discharge: 2020-01-26 | Disposition: A | Discharge status: Home / Self Care | Payer: BC Managed Care – PPO | Source: Ambulatory Visit | Attending: Gastroenterology | Admitting: Gastroenterology

## 2020-01-26 DIAGNOSIS — Z01812 Encounter for preprocedural laboratory examination: Secondary | ICD-10-CM | POA: Insufficient documentation

## 2020-01-26 DIAGNOSIS — Z20822 Contact with and (suspected) exposure to covid-19: Secondary | ICD-10-CM | POA: Insufficient documentation

## 2020-01-27 ENCOUNTER — Encounter: Payer: Self-pay | Admitting: Gastroenterology

## 2020-01-27 LAB — SARS CORONAVIRUS 2 (TAT 6-24 HRS): SARS Coronavirus 2: NEGATIVE

## 2020-01-30 ENCOUNTER — Ambulatory Visit
Admission: RE | Admit: 2020-01-30 | Discharge: 2020-01-30 | Disposition: A | Discharge status: Home / Self Care | Payer: BC Managed Care – PPO | Attending: Gastroenterology | Admitting: Gastroenterology

## 2020-01-30 ENCOUNTER — Ambulatory Visit: Payer: BC Managed Care – PPO | Admitting: Anesthesiology

## 2020-01-30 ENCOUNTER — Encounter
Admission: RE | Disposition: A | Discharge status: Home / Self Care | Payer: Self-pay | Source: Home / Self Care | Attending: Gastroenterology

## 2020-01-30 ENCOUNTER — Other Ambulatory Visit: Payer: Self-pay

## 2020-01-30 DIAGNOSIS — Q249 Congenital malformation of heart, unspecified: Secondary | ICD-10-CM | POA: Insufficient documentation

## 2020-01-30 DIAGNOSIS — Z1211 Encounter for screening for malignant neoplasm of colon: Secondary | ICD-10-CM

## 2020-01-30 DIAGNOSIS — K5730 Diverticulosis of large intestine without perforation or abscess without bleeding: Secondary | ICD-10-CM | POA: Diagnosis not present

## 2020-01-30 DIAGNOSIS — K5790 Diverticulosis of intestine, part unspecified, without perforation or abscess without bleeding: Secondary | ICD-10-CM | POA: Diagnosis not present

## 2020-01-30 HISTORY — PX: COLONOSCOPY WITH PROPOFOL: SHX5780

## 2020-01-30 SURGERY — COLONOSCOPY WITH PROPOFOL
Anesthesia: General

## 2020-01-30 MED ORDER — FENTANYL CITRATE (PF) 100 MCG/2ML IJ SOLN
INTRAMUSCULAR | Status: AC
  Filled 2020-01-30: qty 2

## 2020-01-30 MED ORDER — PROPOFOL 10 MG/ML IV BOLUS
INTRAVENOUS | Status: DC | PRN
  Administered 2020-01-30: 20 mg via INTRAVENOUS
  Administered 2020-01-30: 50 mg via INTRAVENOUS
  Administered 2020-01-30: 20 mg via INTRAVENOUS

## 2020-01-30 MED ORDER — LIDOCAINE HCL (PF) 2 % IJ SOLN
INTRAMUSCULAR | Status: AC
  Filled 2020-01-30: qty 5

## 2020-01-30 MED ORDER — PROPOFOL 500 MG/50ML IV EMUL
INTRAVENOUS | Status: AC
  Filled 2020-01-30: qty 50

## 2020-01-30 MED ORDER — PROPOFOL 500 MG/50ML IV EMUL
INTRAVENOUS | Status: DC | PRN
  Administered 2020-01-30: 150 ug/kg/min via INTRAVENOUS

## 2020-01-30 MED ORDER — SODIUM CHLORIDE 0.9 % IV SOLN
INTRAVENOUS | Status: DC
  Administered 2020-01-30: 20 mL/h via INTRAVENOUS

## 2020-01-30 MED ORDER — FENTANYL CITRATE (PF) 100 MCG/2ML IJ SOLN
INTRAMUSCULAR | Status: DC | PRN
Start: 2020-01-30 — End: 2020-01-30
  Administered 2020-01-30: 50 ug via INTRAVENOUS

## 2020-01-30 MED ORDER — PROPOFOL 10 MG/ML IV BOLUS
INTRAVENOUS | Status: AC
  Filled 2020-01-30: qty 20

## 2020-01-30 NOTE — Transfer of Care (Signed)
Immediate Anesthesia Transfer of Care Note  Patient: John Christensen  Procedure(s) Performed: COLONOSCOPY WITH PROPOFOL (N/A )  Patient Location: PACU  Anesthesia Type:General  Level of Consciousness: awake, alert  and oriented  Airway & Oxygen Therapy: Patient Spontanous Breathing and Patient connected to nasal cannula oxygen  Post-op Assessment: Report given to RN and Post -op Vital signs reviewed and stable  Post vital signs: Reviewed and stable  Last Vitals:  Vitals Value Taken Time  BP 105/69 01/30/20 1200  Temp 36.1 C 01/30/20 1158  Pulse 59 01/30/20 1202  Resp 18 01/30/20 1202  SpO2 97 % 01/30/20 1202  Vitals shown include unvalidated device data.  Last Pain:  Vitals:   01/30/20 1158  TempSrc: Temporal  PainSc: 0-No pain         Complications: No complications documented.

## 2020-01-30 NOTE — Anesthesia Preprocedure Evaluation (Signed)
Anesthesia Evaluation  Patient identified by MRN, date of birth, ID band Patient awake    Reviewed: Allergy & Precautions, NPO status , Patient's Chart, lab work & pertinent test results  History of Anesthesia Complications Negative for: history of anesthetic complications  Airway Mallampati: II  TM Distance: >3 FB Neck ROM: Full    Dental no notable dental hx. (+) Teeth Intact   Pulmonary neg pulmonary ROS, neg sleep apnea, neg COPD, Patient abstained from smoking.Not current smoker,    Pulmonary exam normal breath sounds clear to auscultation       Cardiovascular Exercise Tolerance: Good METS(-) hypertension(-) CAD and (-) Past MI negative cardio ROS  (-) dysrhythmias  Rhythm:Regular Rate:Normal - Systolic murmurs    Neuro/Psych negative neurological ROS  negative psych ROS   GI/Hepatic neg GERD  ,(+)     (-) substance abuse  ,   Endo/Other  neg diabetes  Renal/GU negative Renal ROS     Musculoskeletal   Abdominal   Peds  Hematology   Anesthesia Other Findings Past Medical History: No date: Cardiac dysrhythmia, unspecified No date: Other malaise and fatigue No date: Unspecified congenital anomaly of heart  Reproductive/Obstetrics                             Anesthesia Physical Anesthesia Plan  ASA: I  Anesthesia Plan: General   Post-op Pain Management:    Induction: Intravenous  PONV Risk Score and Plan: 2 and Ondansetron, Propofol infusion and TIVA  Airway Management Planned: Nasal Cannula  Additional Equipment: None  Intra-op Plan:   Post-operative Plan:   Informed Consent: I have reviewed the patients History and Physical, chart, labs and discussed the procedure including the risks, benefits and alternatives for the proposed anesthesia with the patient or authorized representative who has indicated his/her understanding and acceptance.     Dental advisory  given  Plan Discussed with: CRNA and Surgeon  Anesthesia Plan Comments: (Discussed risks of anesthesia with patient, including possibility of difficulty with spontaneous ventilation under anesthesia necessitating airway intervention, PONV, and rare risks such as cardiac or respiratory or neurological events. Patient understands.)        Anesthesia Quick Evaluation

## 2020-01-30 NOTE — Anesthesia Procedure Notes (Signed)
Date/Time: 01/30/2020 11:40 AM Performed by: Henrietta Hoover, CRNA Pre-anesthesia Checklist: Patient identified, Emergency Drugs available, Suction available, Patient being monitored and Timeout performed Patient Re-evaluated:Patient Re-evaluated prior to induction Oxygen Delivery Method: Nasal cannula Placement Confirmation: positive ETCO2

## 2020-01-30 NOTE — H&P (Signed)
Arlyss Repress, MD [address]  [address]  [place], [place] [address]  Main: [phone]  Fax: [phone] Pager: [phone]  Primary Care Physician:  Smitty Cords, DO Primary Gastroenterologist:  Dr. Arlyss Repress  Pre-Procedure History & Physical: HPI:  John Christensen is a 49 y.o. male is here for an colonoscopy.   Past Medical History:  Diagnosis Date  . Cardiac dysrhythmia, unspecified   . Other malaise and fatigue   . Unspecified congenital anomaly of heart     History reviewed. No pertinent surgical history.  Prior to Admission medications   Not on File    Allergies as of 01/05/2020  . (No Known Allergies)    Family History  Problem Relation Age of Onset  . Hyperlipidemia Mother   . Hyperlipidemia Father   . Colon cancer Neg Hx   . Prostate cancer Neg Hx     Social History   Socioeconomic History  . Marital status: Married    Spouse name: Not on file  . Number of children: Not on file  . Years of education: Not on file  . Highest education level: Not on file  Occupational History  . Not on file  Tobacco Use  . Smoking status: Never Smoker  . Smokeless tobacco: Never Used  Vaping Use  . Vaping Use: Never used  Substance and Sexual Activity  . Alcohol use: No  . Drug use: No  . Sexual activity: Not on file  Other Topics Concern  . Not on file  Social History Narrative  . Not on file   Social Determinants of Health   Financial Resource Strain:   . Difficulty of Paying Living Expenses: Not on file  Food Insecurity:   . Worried About Programme researcher, broadcasting/film/video in the Last Year: Not on file  . Ran Out of Food in the Last Year: Not on file  Transportation Needs:   . Lack of Transportation (Medical): Not on file  . Lack of Transportation (Non-Medical): Not on file  Physical Activity:   . Days of Exercise per Week: Not on file  . Minutes of Exercise per Session: Not on file  Stress:   . Feeling of Stress : Not on file    Social Connections:   . Frequency of Communication with Friends and Family: Not on file  . Frequency of Social Gatherings with Friends and Family: Not on file  . Attends Religious Services: Not on file  . Active Member of Clubs or Organizations: Not on file  . Attends Banker Meetings: Not on file  . Marital Status: Not on file  Intimate Partner Violence:   . Fear of Current or Ex-Partner: Not on file  . Emotionally Abused: Not on file  . Physically Abused: Not on file  . Sexually Abused: Not on file    Review of Systems: See HPI, otherwise negative ROS  Physical Exam: BP 122/73   Pulse (!) 56   Temp (!) 97.3 F (36.3 C) (Temporal)   Resp 20   Ht 5\' 9"  (1.753 m)   Wt 82.1 kg   SpO2 100%   BMI 26.73 kg/m  General:   Alert,  pleasant and cooperative in NAD Head:  Normocephalic and atraumatic. Neck:  Supple; no masses or thyromegaly. Lungs:  Clear throughout to auscultation.    Heart:  Regular rate and rhythm. Abdomen:  Soft, nontender and nondistended. Normal bowel sounds, without guarding, and without rebound.   Neurologic:  Alert and  oriented x4;  grossly normal neurologically.  Impression/Plan: John Christensen is here for an colonoscopy to be performed for colon cancer screening  Risks, benefits, limitations, and alternatives regarding  colonoscopy have been reviewed with the patient.  Questions have been answered.  All parties agreeable.   Lannette Donath, MD  01/30/2020, 10:47 AM

## 2020-01-30 NOTE — Op Note (Signed)
[facility] Gastroenterology Patient Name: John Christensen Procedure Date: 01/30/2020 11:24 AM MRN: [id number] Account #: [id number] Date of Birth: 02-28-1971 Admit Type: Outpatient Age: 49 Room: [facility] ENDO ROOM 2 Gender: Male Note Status: Finalized Procedure:             Colonoscopy Indications:           Screening for colorectal malignant neoplasm, This is                         the patient's first colonoscopy Providers:             Lin Landsman MD, MD Medicines:             Monitored Anesthesia Care Complications:         No immediate complications. Estimated blood loss: None. Procedure:             Pre-Anesthesia Assessment:                        - Prior to the procedure, a History and Physical was                         performed, and patient medications and allergies were                         reviewed. The patient is competent. The risks and                         benefits of the procedure and the sedation options and                         risks were discussed with the patient. All questions                         were answered and informed consent was obtained.                         Patient identification and proposed procedure were                         verified by the physician, the nurse, the                         anesthesiologist, the anesthetist and the technician                         in the pre-procedure area in the procedure room in the                         endoscopy suite. Mental Status Examination: alert and                         oriented. Airway Examination: normal oropharyngeal                         airway and neck mobility. Respiratory Examination:                         clear to auscultation. CV Examination: normal.  Prophylactic Antibiotics: The patient does not require                         prophylactic antibiotics. Prior Anticoagulants: The                         patient has taken no  previous anticoagulant or                         antiplatelet agents. ASA Grade Assessment: I - A                         normal, healthy patient. After reviewing the risks and                         benefits, the patient was deemed in satisfactory                         condition to undergo the procedure. The anesthesia                         plan was to use monitored anesthesia care (MAC).                         Immediately prior to administration of medications,                         the patient was re-assessed for adequacy to receive                         sedatives. The heart rate, respiratory rate, oxygen                         saturations, blood pressure, adequacy of pulmonary                         ventilation, and response to care were monitored                         throughout the procedure. The physical status of the                         patient was re-assessed after the procedure.                        After obtaining informed consent, the colonoscope was                         passed under direct vision. Throughout the procedure,                         the patient's blood pressure, pulse, and oxygen                         saturations were monitored continuously. The                         Colonoscope was introduced through the anus and  advanced to the the cecum, identified by appendiceal                         orifice and ileocecal valve. The colonoscopy was                         performed with moderate difficulty due to multiple                         diverticula in the colon and significant looping.                         Successful completion of the procedure was aided by                         applying abdominal pressure. The patient tolerated the                         procedure well. The quality of the bowel preparation                         was evaluated using the BBPS [facility] Bowel Preparation                          Scale) with scores of: Right Colon = 3, Transverse                         Colon = 3 and Left Colon = 3 (entire mucosa seen well                         with no residual staining, small fragments of stool or                         opaque liquid). The total BBPS score equals 9. Findings:      The perianal and digital rectal examinations were normal. Pertinent       negatives include normal sphincter tone and no palpable rectal lesions.      Multiple diverticula were found in the entire colon.      The retroflexed view of the distal rectum and anal verge was normal and       showed no anal or rectal abnormalities.      The exam was otherwise without abnormality. Impression:            - Diverticulosis in the entire examined colon.                        - The distal rectum and anal verge are normal on                         retroflexion view.                        - The examination was otherwise normal.                        - No specimens collected. Recommendation:        - Discharge patient to home (with escort).                        -  Resume previous diet today.                        - Repeat colonoscopy in 10 years for screening                         purposes. Procedure Code(s):     --- Professional ---                        T6256, Colorectal cancer screening; colonoscopy on                         individual not meeting criteria for high risk Diagnosis Code(s):     --- Professional ---                        K57.30, Diverticulosis of large intestine without                         perforation or abscess without bleeding                        Z12.11, Encounter for screening for malignant neoplasm                         of colon CPT copyright 2019 American [employer]. All rights reserved. The codes documented in this report are preliminary and upon coder review may  be revised to meet current compliance requirements. Dr. Ulyess Mort Lin Landsman MD,  MD 01/30/2020 11:52:20 AM This report has been signed electronically. Number of Addenda: 0 Note Initiated On: 01/30/2020 11:24 AM Scope Withdrawal Time: 0 hours 7 minutes 1 second  Total Procedure Duration: 0 hours 10 minutes 59 seconds  Estimated Blood Loss:  Estimated blood loss: none.      [facility]

## 2020-01-30 NOTE — Anesthesia Postprocedure Evaluation (Signed)
Anesthesia Post Note  Patient: Celia Gibbons  Procedure(s) Performed: COLONOSCOPY WITH PROPOFOL (N/A )  Patient location during evaluation: Endoscopy Anesthesia Type: General Level of consciousness: awake and alert Pain management: pain level controlled Vital Signs Assessment: post-procedure vital signs reviewed and stable Respiratory status: spontaneous breathing, nonlabored ventilation, respiratory function stable and patient connected to nasal cannula oxygen Cardiovascular status: blood pressure returned to baseline and stable Postop Assessment: no apparent nausea or vomiting Anesthetic complications: no   No complications documented.   Last Vitals:  Vitals:   01/30/20 1034 01/30/20 1158  BP: 122/73 105/69  Pulse: (!) 56   Resp: 20   Temp: (!) 36.3 C (!) 36.1 C  SpO2: 100%     Last Pain:  Vitals:   01/30/20 1208  TempSrc:   PainSc: 0-No pain                 Corinda Gubler

## 2020-01-31 ENCOUNTER — Encounter: Payer: Self-pay | Admitting: Gastroenterology

## 2020-12-12 ENCOUNTER — Emergency Department
Admission: EM | Admit: 2020-12-12 | Discharge: 2020-12-12 | Disposition: A | Discharge status: Home / Self Care | Payer: BC Managed Care – PPO | Attending: Student in an Organized Health Care Education/Training Program | Admitting: Student in an Organized Health Care Education/Training Program

## 2020-12-12 ENCOUNTER — Emergency Department: Payer: BC Managed Care – PPO

## 2020-12-12 ENCOUNTER — Other Ambulatory Visit: Payer: Self-pay

## 2020-12-12 DIAGNOSIS — R079 Chest pain, unspecified: Secondary | ICD-10-CM | POA: Insufficient documentation

## 2020-12-12 DIAGNOSIS — Z20822 Contact with and (suspected) exposure to covid-19: Secondary | ICD-10-CM | POA: Insufficient documentation

## 2020-12-12 DIAGNOSIS — J9811 Atelectasis: Secondary | ICD-10-CM | POA: Diagnosis not present

## 2020-12-12 DIAGNOSIS — R0789 Other chest pain: Secondary | ICD-10-CM | POA: Diagnosis not present

## 2020-12-12 LAB — BASIC METABOLIC PANEL
Anion gap: 9 (ref 5–15)
BUN: 14 mg/dL (ref 6–20)
CO2: 27 mmol/L (ref 22–32)
Calcium: 9.3 mg/dL (ref 8.9–10.3)
Chloride: 103 mmol/L (ref 98–111)
Creatinine, Ser: 0.98 mg/dL (ref 0.61–1.24)
GFR, Estimated: >60 mL/min (ref >60)
Glucose, Bld: 108 mg/dL — ABNORMAL HIGH (ref 70–99)
Potassium: 4 mmol/L (ref 3.5–5.1)
Sodium: 139 mmol/L (ref 135–145)

## 2020-12-12 LAB — TROPONIN I (HIGH SENSITIVITY)
Troponin I (High Sensitivity): 2 ng/L (ref <18)
Troponin I (High Sensitivity): 3 ng/L (ref <18)

## 2020-12-12 LAB — CBC
HCT: 42.8 % (ref 39.0–52.0)
Hemoglobin: 14.8 g/dL (ref 13.0–17.0)
MCH: 29.2 pg (ref 26.0–34.0)
MCHC: 34.6 g/dL (ref 30.0–36.0)
MCV: 84.6 fL (ref 80.0–100.0)
Platelets: 245 10*3/uL (ref 150–400)
RBC: 5.06 MIL/uL (ref 4.22–5.81)
RDW: 12.5 % (ref 11.5–15.5)
WBC: 5.7 10*3/uL (ref 4.0–10.5)
nRBC: 0 % (ref 0.0–0.2)

## 2020-12-12 LAB — RESP PANEL BY RT-PCR (FLU A&B, COVID) ARPGX2
Influenza A by PCR: NEGATIVE
Influenza B by PCR: NEGATIVE
SARS Coronavirus 2 by RT PCR: NEGATIVE

## 2020-12-12 LAB — D-DIMER, QUANTITATIVE: D-Dimer, Quant: 0.28 ug/mL-FEU (ref 0.00–0.50)

## 2020-12-12 MED ORDER — ALBUTEROL SULFATE HFA 108 (90 BASE) MCG/ACT IN AERS: 2.0000 | INHALATION_SPRAY | Freq: Four times a day (QID) | RESPIRATORY_TRACT | 2 refills | Status: DC | PRN

## 2020-12-12 MED ORDER — IPRATROPIUM-ALBUTEROL 0.5-2.5 (3) MG/3ML IN SOLN
3.0000 mL | Freq: Once | RESPIRATORY_TRACT | Status: AC
  Administered 2020-12-12: 3 mL via RESPIRATORY_TRACT
  Filled 2020-12-12: qty 3

## 2020-12-12 MED ORDER — DOXYCYCLINE HYCLATE 100 MG PO TABS: 100.0000 mg | ORAL_TABLET | Freq: Two times a day (BID) | ORAL | 0 refills | Status: AC

## 2020-12-12 NOTE — ED Provider Notes (Signed)
[facility] Emergency Department Provider Note    Event Date/Time   First MD Initiated Contact with Patient 12/12/20 [phone]     (approximate)  I have reviewed the triage vital signs and the nursing notes.   HISTORY  Chief Complaint Chest Pain    HPI Wellington Winegarden is a 50 y.o. male the below listed past medical history presents to the ER for roughly 1 week of exertional right-sided chest pain.  No associated pressure nausea or vomiting.  States he did recently get bit by a tick is not noting rashes.  Does not smoke.  No history of coronary disease.  No recent prolonged travel has not noted any swelling in his legs.  No congestion.  Past Medical History:  Diagnosis Date   Cardiac dysrhythmia, unspecified    Other malaise and fatigue    Unspecified congenital anomaly of heart    Family History  Problem Relation Age of Onset   Hyperlipidemia Mother    Hyperlipidemia Father    Colon cancer Neg Hx    Prostate cancer Neg Hx    Past Surgical History:  Procedure Laterality Date   COLONOSCOPY WITH PROPOFOL N/A 01/30/2020   Procedure: COLONOSCOPY WITH PROPOFOL;  Surgeon: Toney Reil, MD;  Location: [facility] ENDOSCOPY;  Service: Gastroenterology;  Laterality: N/A;   Patient Active Problem List   Diagnosis Date Noted   Encounter for screening colonoscopy    History of palpitations 09/11/2016   Personal history of other infectious and parasitic diseases 10/15/2014   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 Authorizing Provider  albuterol (VENTOLIN HFA) 108 (90 Base) MCG/ACT inhaler Inhale 2 puffs into the lungs every 6 (six) hours as needed for wheezing or shortness of breath. 12/12/20  Yes Willy Eddy, MD  doxycycline (VIBRA-TABS) 100 MG tablet Take 1 tablet (100 mg total) by mouth 2 (two) times daily for 7 days. 12/12/20 12/19/20 Yes  Willy Eddy, MD    Allergies Patient has no known allergies.    Social History Social History   Tobacco Use   Smoking status: Never   Smokeless tobacco: Never  Vaping Use   Vaping Use: Never used  Substance Use Topics   Alcohol use: No   Drug use: No    Review of Systems Patient denies headaches, rhinorrhea, blurry vision, numbness, shortness of breath, chest pain, edema, cough, abdominal pain, nausea, vomiting, diarrhea, dysuria, fevers, rashes or hallucinations unless otherwise stated above in HPI. ____________________________________________   PHYSICAL EXAM:  VITAL SIGNS: Vitals:   12/12/20 1040 12/12/20 1224  BP: 128/80 116/75  Pulse: 65 68  Resp: 14 20  Temp: 97.6 F (36.4 C)   SpO2: 99% 99%    Constitutional: Alert and oriented.  Eyes: Conjunctivae are normal.  Head: Atraumatic. Nose: No congestion/rhinnorhea. Mouth/Throat: Mucous membranes are moist.   Neck: No stridor. Painless ROM.  Cardiovascular: Normal rate, regular rhythm. Grossly normal heart sounds.  Good peripheral circulation. Respiratory: Normal respiratory effort.  No retractions. Lungs CTAB. Gastrointestinal: Soft and nontender. No distention. No abdominal bruits. No CVA tenderness. Genitourinary:  Musculoskeletal: No lower extremity tenderness nor edema.  No joint effusions. Neurologic:  Normal speech and language. No gross focal neurologic deficits are appreciated. No facial droop Skin:  Skin is warm, dry and intact. No rash noted. Psychiatric: Mood and affect are normal. Speech and behavior are normal.  ____________________________________________  LABS (all labs ordered are listed, but only abnormal results are displayed)  Results for orders placed or performed during the hospital encounter of 12/12/20 (from the past 24 hour(s))  Basic metabolic panel     Status: Abnormal   Collection Time: 12/12/20  8:51 AM  Result Value Ref Range   Sodium 139 135 - 145 mmol/L   Potassium  4.0 3.5 - 5.1 mmol/L   Chloride 103 98 - 111 mmol/L   CO2 27 22 - 32 mmol/L   Glucose, Bld 108 (H) 70 - 99 mg/dL   BUN 14 6 - 20 mg/dL   Creatinine, Ser 1.32 0.61 - 1.24 mg/dL   Calcium 9.3 8.9 - 44.0 mg/dL   GFR, Estimated >10 >27 mL/min   Anion gap 9 5 - 15  CBC     Status: None   Collection Time: 12/12/20  8:51 AM  Result Value Ref Range   WBC 5.7 4.0 - 10.5 K/uL   RBC 5.06 4.22 - 5.81 MIL/uL   Hemoglobin 14.8 13.0 - 17.0 g/dL   HCT 25.3 66.4 - 40.3 %   MCV 84.6 80.0 - 100.0 fL   MCH 29.2 26.0 - 34.0 pg   MCHC 34.6 30.0 - 36.0 g/dL   RDW 47.4 25.9 - 56.3 %   Platelets 245 150 - 400 K/uL   nRBC 0.0 0.0 - 0.2 %  Troponin I (High Sensitivity)     Status: None   Collection Time: 12/12/20  8:51 AM  Result Value Ref Range   Troponin I (High Sensitivity) 2 <18 ng/L  D-dimer, quantitative     Status: None   Collection Time: 12/12/20  8:51 AM  Result Value Ref Range   D-Dimer, Quant 0.28 0.00 - 0.50 ug/mL-FEU  Resp Panel by RT-PCR (Flu A&B, Covid) Nasopharyngeal Swab     Status: None   Collection Time: 12/12/20  8:58 AM   Specimen: Nasopharyngeal Swab; Nasopharyngeal(NP) swabs in vial transport medium  Result Value Ref Range   SARS Coronavirus 2 by RT PCR NEGATIVE NEGATIVE   Influenza A by PCR NEGATIVE NEGATIVE   Influenza B by PCR NEGATIVE NEGATIVE  Troponin I (High Sensitivity)     Status: None   Collection Time: 12/12/20 10:37 AM  Result Value Ref Range   Troponin I (High Sensitivity) 3 <18 ng/L   ____________________________________________  EKG My review and personal interpretation at Time: 8:18   Indication: chest pain  Rate: 70  Rhythm: sinus Axis: normal Other: normal intervals, no stemi ____________________________________________  RADIOLOGY  .prim ____________________________________________   PROCEDURES  Procedure(s) performed:  Procedures    Critical Care performed: no ____________________________________________   INITIAL IMPRESSION /  ASSESSMENT AND PLAN / ED COURSE  Pertinent labs & imaging results that were available during my care of the patient were reviewed by me and considered in my medical decision making (see chart for details).   DDX: ACS, pericarditis, esophagitis, boerhaaves, pe, dissection, pna, bronchitis, costochondritis   Custer Pimenta is a 50 y.o. who presents to the ED with presentation as described above.  He is well nontoxic-appearing his EKG is nonischemic initial troponin is negative.  Have a lower suspicion for ACS based on duration of symptoms will order serial enzymes.  Low risk by Wells criteria will order D-dimer to further stratify.  Will test for COVID.  Will trial nebulizer to see if it makes any difference I do not appreciate any significant wheezing on exam.  If work-up is reassuring will give outpatient referral  to cardiology given exertional discomfort.  will keep on monitor while observing here in the ER.  Clinical Course as of 12/12/20 1309  Wed Dec 12, 2020  1122 Patient felt some mild improvement after nebulizer therefore will prescribe albuterol inhaler.  His repeat enzyme is negative no sign of COVID.  D-dimer is negative.  Not consistent with PE or dissection.  Will give referral to cardiology given his exertional component of symptoms.  Will cover with doxycycline given recent tickborne illness though he is not showing signs of sepsis or pneumonia.  Does appear stable and appropriate for outpatient follow-up.  [PR]    Clinical Course User Index [PR] Willy Eddy, MD    The patient was evaluated in Emergency Department today for the symptoms described in the history of present illness. He/she was evaluated in the context of the global COVID-19 pandemic, which necessitated consideration that the patient might be at risk for infection with the SARS-CoV-2 virus that causes COVID-19. Institutional protocols and algorithms that pertain to the evaluation of patients at risk for  COVID-19 are in a state of rapid change based on information released by regulatory bodies including the CDC and federal and state organizations. These policies and algorithms were followed during the patient's care in the ED.  As part of my medical decision making, I reviewed the following data within the electronic MEDICAL RECORD NUMBER Nursing notes reviewed and incorporated, Labs reviewed, notes from prior ED visits and [place] Controlled Substance Database   ____________________________________________   FINAL CLINICAL IMPRESSION(S) / ED DIAGNOSES  Final diagnoses:  Nonspecific chest pain      NEW MEDICATIONS STARTED DURING THIS VISIT:  Discharge Medication List as of 12/12/2020 11:22 AM     START taking these medications   Details  albuterol (VENTOLIN HFA) 108 (90 Base) MCG/ACT inhaler Inhale 2 puffs into the lungs every 6 (six) hours as needed for wheezing or shortness of breath., Starting Wed 12/12/2020, Normal    doxycycline (VIBRA-TABS) 100 MG tablet Take 1 tablet (100 mg total) by mouth 2 (two) times daily for 7 days., Starting Wed 12/12/2020, Until Wed 12/19/2020, Normal         Note:  This document was prepared using Dragon voice recognition software and may include unintentional dictation errors.    Willy Eddy, MD 12/12/20 1309

## 2020-12-12 NOTE — ED Notes (Signed)
COVID swab collected and sent to lab.

## 2020-12-12 NOTE — ED Triage Notes (Signed)
Pt comes pov with exertional chest pain for the past 2 weeks. States he's coming in because it hasn't gone away.

## 2020-12-12 NOTE — ED Notes (Signed)
EKG being performed at bedside.  Xray ready to take patient.

## 2020-12-31 ENCOUNTER — Other Ambulatory Visit: Payer: Self-pay

## 2020-12-31 ENCOUNTER — Encounter: Payer: Self-pay | Admitting: Family Medicine

## 2020-12-31 ENCOUNTER — Ambulatory Visit (INDEPENDENT_AMBULATORY_CARE_PROVIDER_SITE_OTHER): Payer: BC Managed Care – PPO | Admitting: Family Medicine

## 2020-12-31 VITALS — BP 121/68 | HR 68 | Ht 69.0 in | Wt 180.4 lb

## 2020-12-31 DIAGNOSIS — Z125 Encounter for screening for malignant neoplasm of prostate: Secondary | ICD-10-CM

## 2020-12-31 DIAGNOSIS — Z0000 Encounter for general adult medical examination without abnormal findings: Secondary | ICD-10-CM | POA: Diagnosis not present

## 2020-12-31 NOTE — Patient Instructions (Addendum)
Thank you for coming to the office today.  Send [place] a copy of the COVID booster info with brand, date, LOT# also could use the LOT # on the other two shots.   DUE for FASTING BLOOD WORK (no food or drink after midnight before the lab appointment, only water or coffee without cream/sugar on the morning of)  SCHEDULE "Lab Only" visit in the morning at the clinic for lab draw in 1 YEAR  - Make sure Lab Only appointment is at about 1 week before your next appointment, so that results will be available  For Lab Results, once available within 2-3 days of blood draw, you can can log in to MyChart online to view your results and a brief explanation. Also, we can discuss results at next follow-up visit.   Please schedule a Follow-up Appointment to: Return in about 1 year (around 12/31/2021) for 1 year Annual Physical AM fasting lab AFTER.  If you have any other questions or concerns, please feel free to call the office or send a message through MyChart. You may also schedule an earlier appointment if necessary.  Additionally, you may be receiving a survey about your experience at our office within a few days to 1 week by e-mail or mail. We value your feedback.  Saralyn Pilar, DO [facility], [place]

## 2020-12-31 NOTE — Progress Notes (Signed)
Subjective:    Patient ID: John Christensen, male    DOB: 1970/11/02, 50 y.o.   MRN: [id number]  John Christensen is a 50 y.o. male presenting on 12/31/2020 for Annual Exam   HPI  Here for Annual Physical and Due for lab.   Lifestyle BMI >26 Reviewed healthy lifestyle and diet / exercise goals He is doing well overall Not on medications or supplements   Additional complaint Admits a dull numbing pain R lower quadrant episodic seems to be on both sides, not related to diet or eating or drinking, and not provoked with lifting or other injury. No history of abdominal surgery in past. Admits regular BMs.   Additional history  Has episodic symptoms of bilateral chest pain discomfort, seen in ED in July 2022 has mixed atypical features and he will see Cardiologist  Health Maintenance:  COVID19 initial vaccine series.   Prostate CA Screening: Last PSA negative x 2 last 2021. Currently asymptomatic. No known family history of prostate CA. Due for screening.   Colon CA Screening: Last colonoscopy 01/30/20 Dr Allegra Lai no polyps, repeat 10 years approx 2031. Currently asymptomatic. No known family history of colon CA.   Depression screen [facility] 2/9 12/31/2020 12/28/2019 09/11/2016  Decreased Interest 0 0 0  Down, Depressed, Hopeless 0 0 0  PHQ - 2 Score 0 0 0  Altered sleeping 0 - 1  Tired, decreased energy 0 - 1  Change in appetite 0 - 0  Feeling bad or failure about yourself  0 - 0  Trouble concentrating 0 - 0  Moving slowly or fidgety/restless 0 - 0  Suicidal thoughts 0 - 0  PHQ-9 Score 0 - 2  Difficult doing work/chores Not difficult at all - Not difficult at all    Past Medical History:  Diagnosis Date   Cardiac dysrhythmia, unspecified    Other malaise and fatigue    Unspecified congenital anomaly of heart    Past Surgical History:  Procedure Laterality Date   COLONOSCOPY WITH PROPOFOL N/A 01/30/2020   Procedure: COLONOSCOPY WITH PROPOFOL;  Surgeon: Toney Reil, MD;  Location: [facility] ENDOSCOPY;  Service: Gastroenterology;  Laterality: N/A;   Social History   Socioeconomic History   Marital status: Married    Spouse name: Not on file   Number of children: Not on file   Years of education: Not on file   Highest education level: Not on file  Occupational History   Not on file  Tobacco Use   Smoking status: Never   Smokeless tobacco: Never  Vaping Use   Vaping Use: Never used  Substance and Sexual Activity   Alcohol use: No   Drug use: No   Sexual activity: Not on file  Other Topics Concern   Not on file  Social History Narrative   Not on file   Social Determinants of Health   Financial Resource Strain: Not on file  Food Insecurity: Not on file  Transportation Needs: Not on file  Physical Activity: Not on file  Stress: Not on file  Social Connections: Not on file  Intimate Partner Violence: Not on file   Family History  Problem Relation Age of Onset   Hyperlipidemia Mother    Hyperlipidemia Father    Colon cancer Neg Hx    Prostate cancer Neg Hx    Current Outpatient Medications on File Prior to Visit  Medication Sig   albuterol (VENTOLIN HFA) 108 (90 Base) MCG/ACT inhaler Inhale 2 puffs into the lungs  every 6 (six) hours as needed for wheezing or shortness of breath.   No current facility-administered medications on file prior to visit.    Review of Systems  Constitutional:  Negative for activity change, appetite change, chills, diaphoresis, fatigue and fever.  HENT:  Negative for congestion and hearing loss.   Eyes:  Negative for visual disturbance.  Respiratory:  Negative for cough, chest tightness, shortness of breath and wheezing.   Cardiovascular:  Negative for chest pain, palpitations and leg swelling.  Gastrointestinal:  Negative for abdominal pain, constipation, diarrhea, nausea and vomiting.  Genitourinary:  Negative for dysuria, frequency and hematuria.  Musculoskeletal:  Negative for arthralgias and neck  pain.  Skin:  Negative for rash.  Neurological:  Negative for dizziness, weakness, light-headedness, numbness and headaches.  Hematological:  Negative for adenopathy.  Psychiatric/Behavioral:  Negative for behavioral problems, dysphoric mood and sleep disturbance.   Per HPI unless specifically indicated above      Objective:    BP 121/68   Pulse 68   Ht 5\' 9"  (1.753 m)   Wt 180 lb 6.4 oz (81.8 kg)   SpO2 99%   BMI 26.64 kg/m   Wt Readings from Last 3 Encounters:  12/31/20 180 lb 6.4 oz (81.8 kg)  12/12/20 183 lb (83 kg)  01/30/20 181 lb (82.1 kg)    Physical Exam Vitals and nursing note reviewed.  Constitutional:      General: He is not in acute distress.    Appearance: He is well-developed. He is not diaphoretic.     Comments: Well-appearing, comfortable, cooperative  HENT:     Head: Normocephalic and atraumatic.     Right Ear: Tympanic membrane, ear canal and external ear normal. There is no impacted cerumen.     Left Ear: Tympanic membrane, ear canal and external ear normal. There is no impacted cerumen.  Eyes:     General:        Right eye: No discharge.        Left eye: No discharge.     Conjunctiva/sclera: Conjunctivae normal.     Pupils: Pupils are equal, round, and reactive to light.  Neck:     Thyroid: No thyromegaly.  Cardiovascular:     Rate and Rhythm: Normal rate and regular rhythm.     Pulses: Normal pulses.     Heart sounds: Normal heart sounds. No murmur heard. Pulmonary:     Effort: Pulmonary effort is normal. No respiratory distress.     Breath sounds: Normal breath sounds. No wheezing or rales.  Abdominal:     General: Bowel sounds are normal. There is no distension.     Palpations: Abdomen is soft. There is no mass.     Tenderness: There is no abdominal tenderness.  Musculoskeletal:        General: No tenderness. Normal range of motion.     Cervical back: Normal range of motion and neck supple.     Comments: Upper / Lower Extremities: -  Normal muscle tone, strength bilateral upper extremities 5/5, lower extremities 5/5  Lymphadenopathy:     Cervical: No cervical adenopathy.  Skin:    General: Skin is warm and dry.     Findings: No erythema or rash.  Neurological:     Mental Status: He is alert and oriented to person, place, and time.     Comments: Distal sensation intact to light touch all extremities  Psychiatric:        Mood and Affect: Mood normal.  Behavior: Behavior normal.        Thought Content: Thought content normal.     Comments: Well groomed, good eye contact, normal speech and thoughts   Results for orders placed or performed during the hospital encounter of 12/12/20  Resp Panel by RT-PCR (Flu A&B, Covid) Nasopharyngeal Swab   Specimen: Nasopharyngeal Swab; Nasopharyngeal(NP) swabs in vial transport medium  Result Value Ref Range   SARS Coronavirus 2 by RT PCR NEGATIVE NEGATIVE   Influenza A by PCR NEGATIVE NEGATIVE   Influenza B by PCR NEGATIVE NEGATIVE  Basic metabolic panel  Result Value Ref Range   Sodium 139 135 - 145 mmol/L   Potassium 4.0 3.5 - 5.1 mmol/L   Chloride 103 98 - 111 mmol/L   CO2 27 22 - 32 mmol/L   Glucose, Bld 108 (H) 70 - 99 mg/dL   BUN 14 6 - 20 mg/dL   Creatinine, Ser 0.24 0.61 - 1.24 mg/dL   Calcium 9.3 8.9 - 09.7 mg/dL   GFR, Estimated >35 >32 mL/min   Anion gap 9 5 - 15  CBC  Result Value Ref Range   WBC 5.7 4.0 - 10.5 K/uL   RBC 5.06 4.22 - 5.81 MIL/uL   Hemoglobin 14.8 13.0 - 17.0 g/dL   HCT 99.2 42.6 - 83.4 %   MCV 84.6 80.0 - 100.0 fL   MCH 29.2 26.0 - 34.0 pg   MCHC 34.6 30.0 - 36.0 g/dL   RDW 19.6 22.2 - 97.9 %   Platelets 245 150 - 400 K/uL   nRBC 0.0 0.0 - 0.2 %  D-dimer, quantitative  Result Value Ref Range   D-Dimer, Quant 0.28 0.00 - 0.50 ug/mL-FEU  Troponin I (High Sensitivity)  Result Value Ref Range   Troponin I (High Sensitivity) 2 <18 ng/L  Troponin I (High Sensitivity)  Result Value Ref Range   Troponin I (High Sensitivity) 3 <18 ng/L       Assessment & Plan:   Problem List Items Addressed This Visit   None Visit Diagnoses     Annual physical exam    -  Primary   Relevant Orders   COMPLETE METABOLIC PANEL WITH GFR   Lipid panel   CBC with Differential/Platelet   PSA   Hemoglobin A1c   Screening for prostate cancer       Relevant Orders   PSA       Updated Health Maintenance information - Colonoscopy 01/2020 negative no polyp repeat 10 year. 2031 - PSA today with lab Fasting labs today, f/u results. Encouraged improvement to lifestyle with diet and exercise Goal of weight loss   No orders of the defined types were placed in this encounter.     Follow up plan: Return in about 1 year (around 12/31/2021) for 1 year Annual Physical AM fasting lab AFTER.  Saralyn Pilar, DO [facility] 12/31/2020, 8:12 AM

## 2021-01-01 LAB — PSA: PSA: 0.42 ng/mL (ref <=4.00)

## 2021-01-01 LAB — HEMOGLOBIN A1C
Hgb A1c MFr Bld: 4.7 % of total Hgb (ref <5.7)
Mean Plasma Glucose: 88 mg/dL
eAG (mmol/L): 4.9 mmol/L

## 2021-01-01 LAB — CBC WITH DIFFERENTIAL/PLATELET
Absolute Monocytes: 484 cells/uL (ref 200–950)
Basophils Absolute: 52 cells/uL (ref 0–200)
Basophils Relative: 1 %
Eosinophils Absolute: 109 cells/uL (ref 15–500)
Eosinophils Relative: 2.1 %
HCT: 46.1 % (ref 38.5–50.0)
Hemoglobin: 15.7 g/dL (ref 13.2–17.1)
Lymphs Abs: 1456 cells/uL (ref 850–3900)
MCH: 29.5 pg (ref 27.0–33.0)
MCHC: 34.1 g/dL (ref 32.0–36.0)
MCV: 86.7 fL (ref 80.0–100.0)
MPV: 9.9 fL (ref 7.5–12.5)
Monocytes Relative: 9.3 %
Neutro Abs: 3099 cells/uL (ref 1500–7800)
Neutrophils Relative %: 59.6 %
Platelets: 289 10*3/uL (ref 140–400)
RBC: 5.32 10*6/uL (ref 4.20–5.80)
RDW: 12.4 % (ref 11.0–15.0)
Total Lymphocyte: 28 %
WBC: 5.2 10*3/uL (ref 3.8–10.8)

## 2021-01-01 LAB — COMPLETE METABOLIC PANEL WITH GFR
AG Ratio: 1.6 (calc) (ref 1.0–2.5)
ALT: 18 U/L (ref 9–46)
AST: 14 U/L (ref 10–35)
Albumin: 4.3 g/dL (ref 3.6–5.1)
Alkaline phosphatase (APISO): 46 U/L (ref 35–144)
BUN: 15 mg/dL (ref 7–25)
CO2: 30 mmol/L (ref 20–32)
Calcium: 9.6 mg/dL (ref 8.6–10.3)
Chloride: 106 mmol/L (ref 98–110)
Creat: 1.07 mg/dL (ref 0.70–1.30)
Globulin: 2.7 g/dL (calc) (ref 1.9–3.7)
Glucose, Bld: 100 mg/dL — ABNORMAL HIGH (ref 65–99)
Potassium: 4.3 mmol/L (ref 3.5–5.3)
Sodium: 141 mmol/L (ref 135–146)
Total Bilirubin: 0.9 mg/dL (ref 0.2–1.2)
Total Protein: 7 g/dL (ref 6.1–8.1)
eGFR: 84 mL/min/{1.73_m2} (ref >=60)

## 2021-01-01 LAB — LIPID PANEL
Cholesterol: 182 mg/dL (ref <200)
HDL: 55 mg/dL (ref >=40)
LDL Cholesterol (Calc): 111 mg/dL (calc) — ABNORMAL HIGH
Non-HDL Cholesterol (Calc): 127 mg/dL (calc) (ref <130)
Total CHOL/HDL Ratio: 3.3 (calc) (ref <5.0)
Triglycerides: 69 mg/dL (ref <150)

## 2021-02-01 ENCOUNTER — Encounter: Payer: Self-pay | Admitting: Cardiology

## 2021-02-01 ENCOUNTER — Other Ambulatory Visit: Payer: Self-pay

## 2021-02-01 ENCOUNTER — Ambulatory Visit: Payer: BC Managed Care – PPO | Admitting: Cardiology

## 2021-02-01 VITALS — BP 120/84 | HR 61 | Ht 69.0 in | Wt 179.0 lb

## 2021-02-01 DIAGNOSIS — R0600 Dyspnea, unspecified: Secondary | ICD-10-CM | POA: Diagnosis not present

## 2021-02-01 DIAGNOSIS — R0609 Other forms of dyspnea: Secondary | ICD-10-CM

## 2021-02-01 DIAGNOSIS — R072 Precordial pain: Secondary | ICD-10-CM

## 2021-02-01 MED ORDER — METOPROLOL TARTRATE 50 MG PO TABS: 50.0000 mg | ORAL_TABLET | Freq: Once | ORAL | 0 refills | Status: DC

## 2021-02-01 NOTE — Progress Notes (Signed)
Cardiology Office Note:    Date:  02/01/2021   ID:  John Christensen, DOB Feb 26, 1971, MRN [id number]  PCP:  Smitty Cords, DO   [facility] Providers Cardiologist:  None     Referring MD: Willy Eddy, MD   Chief Complaint  Patient presents with   New Patient (Initial Visit)    Referred by PCP for Chest pain. Patient c.o chest tightness / Fullness when walking, Hiking, or running the dog that causes SOB. Meds reviewed verbally with patient.    John Christensen is a 50 y.o. male who is being seen today for the evaluation of chest pain at the request of Willy Eddy, MD.   History of Present Illness:    John Christensen is a 50 y.o. male with no significant past medical history who presents due to chest pain.  Patient states having symptoms of chest discomfort ongoing over the past 3 months.  He says [employer] by profession, involved in lots of hiking.  States getting chest tightness whenever he goes up a hill or runs with his dog.  Symptoms are relieved with rest.  He denies smoking, denies any history of heart disease.  States history of MI in his grandfather.  Past Medical History:  Diagnosis Date   Cardiac dysrhythmia, unspecified    Other malaise and fatigue    Unspecified congenital anomaly of heart     Past Surgical History:  Procedure Laterality Date   COLONOSCOPY WITH PROPOFOL N/A 01/30/2020   Procedure: COLONOSCOPY WITH PROPOFOL;  Surgeon: Toney Reil, MD;  Location: [facility] ENDOSCOPY;  Service: Gastroenterology;  Laterality: N/A;    Current Medications: Current Meds  Medication Sig   albuterol (VENTOLIN HFA) 108 (90 Base) MCG/ACT inhaler Inhale 2 puffs into the lungs every 6 (six) hours as needed for wheezing or shortness of breath.   metoprolol tartrate (LOPRESSOR) 50 MG tablet Take 1 tablet (50 mg total) by mouth once for 1 dose. Take 2 hours prior to your CT scan.     Allergies:   Patient has no known allergies.   Social  History   Socioeconomic History   Marital status: Married    Spouse name: Not on file   Number of children: Not on file   Years of education: Not on file   Highest education level: Not on file  Occupational History   Not on file  Tobacco Use   Smoking status: Never   Smokeless tobacco: Never  Vaping Use   Vaping Use: Never used  Substance and Sexual Activity   Alcohol use: No   Drug use: No   Sexual activity: Not on file  Other Topics Concern   Not on file  Social History Narrative   Not on file   Social Determinants of Health   Financial Resource Strain: Not on file  Food Insecurity: Not on file  Transportation Needs: Not on file  Physical Activity: Not on file  Stress: Not on file  Social Connections: Not on file     Family History: The patient's family history includes Hyperlipidemia in his father and mother. There is no history of Colon cancer or Prostate cancer.  ROS:   Please see the history of present illness.     All other systems reviewed and are negative.  EKGs/Labs/Other Studies Reviewed:    The following studies were reviewed today:   EKG:  EKG is  ordered today.  The ekg ordered today demonstrates normal sinus rhythm, normal ECG.  Recent Labs:  12/31/2020: ALT 18; BUN 15; Creat 1.07; Hemoglobin 15.7; Platelets 289; Potassium 4.3; Sodium 141  Recent Lipid Panel    Component Value Date/Time   CHOL 182 12/31/2020 0833   CHOL 174 10/18/2014 1544   TRIG 69 12/31/2020 0833   HDL 55 12/31/2020 0833   HDL 59 10/18/2014 1544   CHOLHDL 3.3 12/31/2020 0833   VLDL 9 09/09/2016 0922   LDLCALC 111 (H) 12/31/2020 0833     Risk Assessment/Calculations:          Physical Exam:    VS:  BP 120/84 (BP Location: Left Arm, Patient Position: Sitting, Cuff Size: Normal)   Pulse 61   Ht 5\' 9"  (1.753 m)   Wt 179 lb (81.2 kg)   SpO2 97%   BMI 26.43 kg/m     Wt Readings from Last 3 Encounters:  02/01/21 179 lb (81.2 kg)  12/31/20 180 lb 6.4 oz (81.8 kg)   12/12/20 183 lb (83 kg)     GEN:  Well nourished, well developed in no acute distress HEENT: Normal NECK: No JVD; No carotid bruits LYMPHATICS: No lymphadenopathy CARDIAC: RRR, no murmurs, rubs, gallops RESPIRATORY:  Clear to auscultation without rales, wheezing or rhonchi  ABDOMEN: Soft, non-tender, non-distended MUSCULOSKELETAL:  No edema; No deformity  SKIN: Warm and dry NEUROLOGIC:  Alert and oriented x 3 PSYCHIATRIC:  Normal affect   ASSESSMENT:    1. Precordial pain   2. Dyspnea on exertion    PLAN:    In order of problems listed above:  Chest pain, symptoms consistent with angina.  Obtain echocardiogram, get coronary CTA to evaluate presence of CAD. Shortness of breath with exertion, this could be an anginal equivalent.  Echo and CTA as above.   Follow-up after echo and coronary CTA    Medication Adjustments/Labs and Tests Ordered: Current medicines are reviewed at length with the patient today.  Concerns regarding medicines are outlined above.  Orders Placed This Encounter  Procedures   CT CORONARY MORPH W/CTA COR W/SCORE W/CA W/CM &/OR WO/CM   EKG 12-Lead   ECHOCARDIOGRAM COMPLETE    Meds ordered this encounter  Medications   metoprolol tartrate (LOPRESSOR) 50 MG tablet    Sig: Take 1 tablet (50 mg total) by mouth once for 1 dose. Take 2 hours prior to your CT scan.    Dispense:  1 tablet    Refill:  0     Patient Instructions  Medication Instructions:   Your physician recommends that you continue on your current medications as directed. Please refer to the Current Medication list given to you today.  *If you need a refill on your cardiac medications before your next appointment, please call your pharmacy*   Lab Work:  BMP to be drawn today.  If you have labs (blood work) drawn today and your tests are completely normal, you will receive your results only by: MyChart Message (if you have MyChart) OR A paper copy in the mail If you have any  lab test that is abnormal or we need to change your treatment, we will call you to review the results.   Testing/Procedures:   Your physician has requested that you have an echocardiogram. Echocardiography is a painless test that uses sound waves to create images of your heart. It provides your doctor with information about the size and shape of your heart and how well your heart's chambers and valves are working. This procedure takes approximately one hour. There are no restrictions for this procedure.  2.   Your physician has requested that you have cardiac CT. Cardiac computed tomography (CT) is a painless test that uses an x-ray machine to take clear, detailed pictures of your heart.    Your appointment is on Thursday 9/22  at 8:00am , if your insurance is not authorized we will call you to cancel.   Your cardiac CT will be scheduled at:        [facility] [address] [place], [place] [address] [phone]  Please arrive 15 mins early for check-in and test prep.    Please follow these instructions carefully (unless otherwise directed):   On the Night Before the Test: Be sure to Drink plenty of water. Do not consume any caffeinated/decaffeinated beverages or chocolate 12 hours prior to your test.   On the Day of the Test: Drink plenty of water until 1 hour prior to the test. Do not eat any food 4 hours prior to the test. You may take your regular medications prior to the test.  Take metoprolol (Lopressor) two hours prior to test. (This was sent to your pharmacy)       After the Test: Drink plenty of water. After receiving IV contrast, you may experience a mild flushed feeling. This is normal. On occasion, you may experience a mild rash up to 24 hours after the test. This is not dangerous. If this occurs, you can take Benadryl 25 mg and increase your fluid intake. If you experience trouble breathing, this can be  serious. If it is severe call 911 IMMEDIATELY. If it is mild, please call our office. If you take any of these medications: Glipizide/Metformin, Avandament, Glucavance, please do not take 48 hours after completing test unless otherwise instructed.  Please allow 2-4 weeks for scheduling of routine cardiac CTs. Some insurance companies require a pre-authorization which may delay scheduling of this test.   For non-scheduling related questions, please contact the cardiac imaging nurse navigator should you have any questions/concerns: Rockwell Alexandria, Cardiac Imaging Nurse Navigator Larey Brick, Cardiac Imaging Nurse Navigator [place] [facility] Direct Office Dial: [phone]   For scheduling needs, including cancellations and rescheduling, please call [place], [phone].    Follow-Up: At [facility], you and your health needs are our priority.  As part of our continuing mission to provide you with exceptional heart care, we have created designated Provider Care Teams.  These Care Teams include your primary Cardiologist (physician) and Advanced Practice Providers (APPs -  Physician Assistants and Nurse Practitioners) who all work together to provide you with the care you need, when you need it.  We recommend signing up for the patient portal called "MyChart".  Sign up information is provided on this After Visit Summary.  MyChart is used to connect with patients for Virtual Visits (Telemedicine).  Patients are able to view lab/test results, encounter notes, upcoming appointments, etc.  Non-urgent messages can be sent to your provider as well.   To learn more about what you can do with MyChart, go to [email or web address].    Your next appointment:   Follow up as needed   The format for your next appointment:   In Person  Provider:   You may see Dr. Azucena Cecil or one of the following Advanced Practice Providers on your designated Care Team:   Nicolasa Ducking,  NP Eula Listen, PA-C Marisue Ivan, PA-C Cadence Hanna, [place]   Other Instructions    Signed, Debbe Odea, MD  02/01/2021 12:55  PM    [facility]

## 2021-02-01 NOTE — Patient Instructions (Addendum)
Medication Instructions:   Your physician recommends that you continue on your current medications as directed. Please refer to the Current Medication list given to you today.  *If you need a refill on your cardiac medications before your next appointment, please call your pharmacy*   Lab Work:  BMP to be drawn today.  If you have labs (blood work) drawn today and your tests are completely normal, you will receive your results only by: MyChart Message (if you have MyChart) OR A paper copy in the mail If you have any lab test that is abnormal or we need to change your treatment, we will call you to review the results.   Testing/Procedures:   Your physician has requested that you have an echocardiogram. Echocardiography is a painless test that uses sound waves to create images of your heart. It provides your doctor with information about the size and shape of your heart and how well your heart's chambers and valves are working. This procedure takes approximately one hour. There are no restrictions for this procedure.   2.   Your physician has requested that you have cardiac CT. Cardiac computed tomography (CT) is a painless test that uses an x-ray machine to take clear, detailed pictures of your heart.    Your appointment is on Thursday 9/22  at 8:00am , if your insurance is not authorized we will call you to cancel.   Your cardiac CT will be scheduled at:        [facility] [address] [place], [place] [address] [phone]  Please arrive 15 mins early for check-in and test prep.    Please follow these instructions carefully (unless otherwise directed):   On the Night Before the Test: Be sure to Drink plenty of water. Do not consume any caffeinated/decaffeinated beverages or chocolate 12 hours prior to your test.   On the Day of the Test: Drink plenty of water until 1 hour prior to the test. Do not eat any food 4 hours  prior to the test. You may take your regular medications prior to the test.  Take metoprolol (Lopressor) two hours prior to test. (This was sent to your pharmacy)       After the Test: Drink plenty of water. After receiving IV contrast, you may experience a mild flushed feeling. This is normal. On occasion, you may experience a mild rash up to 24 hours after the test. This is not dangerous. If this occurs, you can take Benadryl 25 mg and increase your fluid intake. If you experience trouble breathing, this can be serious. If it is severe call 911 IMMEDIATELY. If it is mild, please call our office. If you take any of these medications: Glipizide/Metformin, Avandament, Glucavance, please do not take 48 hours after completing test unless otherwise instructed.  Please allow 2-4 weeks for scheduling of routine cardiac CTs. Some insurance companies require a pre-authorization which may delay scheduling of this test.   For non-scheduling related questions, please contact the cardiac imaging nurse navigator should you have any questions/concerns: Rockwell Alexandria, Cardiac Imaging Nurse Navigator Larey Brick, Cardiac Imaging Nurse Navigator  [facility] Direct Office Dial: [phone]   For scheduling needs, including cancellations and rescheduling, please call [place], [phone].    Follow-Up: At [facility], you and your health needs are our priority.  As part of our continuing mission to provide you with exceptional heart care, we have created designated Provider Care Teams.  These Care  Teams include your primary Cardiologist (physician) and Advanced Practice Providers (APPs -  Physician Assistants and Nurse Practitioners) who all work together to provide you with the care you need, when you need it.  We recommend signing up for the patient portal called "MyChart".  Sign up information is provided on this After Visit Summary.  MyChart is used to connect with  patients for Virtual Visits (Telemedicine).  Patients are able to view lab/test results, encounter notes, upcoming appointments, etc.  Non-urgent messages can be sent to your provider as well.   To learn more about what you can do with MyChart, go to [email or web address].    Your next appointment:   Follow up as needed   The format for your next appointment:   In Person  Provider:   You may see Dr. Azucena Cecil or one of the following Advanced Practice Providers on your designated Care Team:   Nicolasa Ducking, NP Eula Listen, PA-C Marisue Ivan, PA-C Cadence Fransico Michael, [place]   Other Instructions

## 2021-02-05 ENCOUNTER — Telehealth (HOSPITAL_COMMUNITY): Payer: Self-pay | Admitting: *Deleted

## 2021-02-05 NOTE — Telephone Encounter (Signed)
Reaching out to patient to offer assistance regarding upcoming cardiac imaging study; pt verbalizes understanding of appt date/time, parking situation and where to check in, pre-test NPO status and medications ordered, and verified current allergies; name and call back number provided for further questions should they arise  Kendrick Haapala RN Navigator Cardiac Imaging [place] [facility] [facility] [phone] office [phone] cell  Patient to take 50mg metoprolol tartrate two hours prior to cardiac CT scan. 

## 2021-02-07 ENCOUNTER — Other Ambulatory Visit: Payer: Self-pay

## 2021-02-07 ENCOUNTER — Other Ambulatory Visit: Payer: Self-pay | Admitting: Cardiology

## 2021-02-07 ENCOUNTER — Ambulatory Visit
Admission: RE | Admit: 2021-02-07 | Discharge: 2021-02-07 | Disposition: A | Discharge status: Home / Self Care | Payer: BC Managed Care – PPO | Source: Ambulatory Visit | Attending: Cardiology | Admitting: Cardiology

## 2021-02-07 DIAGNOSIS — R931 Abnormal findings on diagnostic imaging of heart and coronary circulation: Secondary | ICD-10-CM

## 2021-02-07 DIAGNOSIS — I2510 Atherosclerotic heart disease of native coronary artery without angina pectoris: Secondary | ICD-10-CM | POA: Diagnosis not present

## 2021-02-07 DIAGNOSIS — R072 Precordial pain: Secondary | ICD-10-CM | POA: Diagnosis not present

## 2021-02-07 MED ORDER — IOHEXOL 350 MG/ML SOLN
75.0000 mL | Freq: Once | INTRAVENOUS | Status: AC | PRN
  Administered 2021-02-07: 75 mL via INTRAVENOUS

## 2021-02-07 MED ORDER — NITROGLYCERIN 0.4 MG SL SUBL
0.8000 mg | SUBLINGUAL_TABLET | Freq: Once | SUBLINGUAL | Status: AC
  Administered 2021-02-07: 0.8 mg via SUBLINGUAL

## 2021-02-08 ENCOUNTER — Encounter: Payer: Self-pay | Admitting: Cardiology

## 2021-02-08 ENCOUNTER — Other Ambulatory Visit
Admission: RE | Admit: 2021-02-08 | Discharge: 2021-02-08 | Disposition: A | Discharge status: Home / Self Care | Payer: BC Managed Care – PPO | Attending: Cardiology | Admitting: Cardiology

## 2021-02-08 ENCOUNTER — Ambulatory Visit: Payer: BC Managed Care – PPO | Admitting: Cardiology

## 2021-02-08 VITALS — BP 112/82 | HR 66 | Ht 69.0 in | Wt 176.0 lb

## 2021-02-08 DIAGNOSIS — I2510 Atherosclerotic heart disease of native coronary artery without angina pectoris: Secondary | ICD-10-CM | POA: Insufficient documentation

## 2021-02-08 DIAGNOSIS — E785 Hyperlipidemia, unspecified: Secondary | ICD-10-CM | POA: Diagnosis not present

## 2021-02-08 LAB — BASIC METABOLIC PANEL
Anion gap: 9 (ref 5–15)
BUN: 15 mg/dL (ref 6–20)
CO2: 26 mmol/L (ref 22–32)
Calcium: 9.3 mg/dL (ref 8.9–10.3)
Chloride: 102 mmol/L (ref 98–111)
Creatinine, Ser: 1.06 mg/dL (ref 0.61–1.24)
GFR, Estimated: >60 mL/min (ref >60)
Glucose, Bld: 85 mg/dL (ref 70–99)
Potassium: 3.8 mmol/L (ref 3.5–5.1)
Sodium: 137 mmol/L (ref 135–145)

## 2021-02-08 LAB — CBC
HCT: 42.2 % (ref 39.0–52.0)
Hemoglobin: 15.3 g/dL (ref 13.0–17.0)
MCH: 30.7 pg (ref 26.0–34.0)
MCHC: 36.3 g/dL — ABNORMAL HIGH (ref 30.0–36.0)
MCV: 84.7 fL (ref 80.0–100.0)
Platelets: 279 10*3/uL (ref 150–400)
RBC: 4.98 MIL/uL (ref 4.22–5.81)
RDW: 12.3 % (ref 11.5–15.5)
WBC: 7 10*3/uL (ref 4.0–10.5)
nRBC: 0 % (ref 0.0–0.2)

## 2021-02-08 MED ORDER — NITROGLYCERIN 0.4 MG SL SUBL: 0.4000 mg | SUBLINGUAL_TABLET | SUBLINGUAL | 3 refills | Status: DC | PRN

## 2021-02-08 MED ORDER — ASPIRIN EC 81 MG PO TBEC: 81.0000 mg | DELAYED_RELEASE_TABLET | Freq: Every day | ORAL | 3 refills | Status: DC

## 2021-02-08 MED ORDER — ATORVASTATIN CALCIUM 40 MG PO TABS: 40.0000 mg | ORAL_TABLET | Freq: Every day | ORAL | 5 refills | Status: DC

## 2021-02-08 NOTE — Progress Notes (Signed)
Cardiology Office Note:    Date:  02/08/2021   ID:  John Christensen, DOB 1971/04/22, MRN [id number]  PCP:  Smitty Cords, DO   [facility] Providers Cardiologist:  None     Referring MD: Saralyn Pilar *   Chief Complaint  Patient presents with   Follow-up    Follow up visit to discuss test results. Medications verbally reviewed with patient.    John Christensen is a 50 y.o. male who is being seen today for the evaluation of chest pain at the request of Saralyn Pilar *.   History of Present Illness:    John Christensen is a 50 y.o. male with no significant past medical history who presents for follow-up.  He was last seen due to chest pain consistent with angina.  His work involves a lot of hiking causing chest discomfort.  Underwent coronary CTA which showed significant proximal LAD disease.  He now presents for follow-up.  States still having symptoms of chest pain with exertion.   Past Medical History:  Diagnosis Date   Cardiac dysrhythmia, unspecified    Other malaise and fatigue    Unspecified congenital anomaly of heart     Past Surgical History:  Procedure Laterality Date   COLONOSCOPY WITH PROPOFOL N/A 01/30/2020   Procedure: COLONOSCOPY WITH PROPOFOL;  Surgeon: Toney Reil, MD;  Location: [facility] ENDOSCOPY;  Service: Gastroenterology;  Laterality: N/A;    Current Medications: Current Meds  Medication Sig   aspirin EC 81 MG tablet Take 1 tablet (81 mg total) by mouth daily. Swallow whole.   atorvastatin (LIPITOR) 40 MG tablet Take 1 tablet (40 mg total) by mouth daily.   nitroGLYCERIN (NITROSTAT) 0.4 MG SL tablet Place 1 tablet (0.4 mg total) under the tongue every 5 (five) minutes as needed for chest pain. For a maximum of 3 doses     Allergies:   Patient has no known allergies.   Social History   Socioeconomic History   Marital status: Married    Spouse name: Not on file   Number of children: Not on file    Years of education: Not on file   Highest education level: Not on file  Occupational History   Not on file  Tobacco Use   Smoking status: Never   Smokeless tobacco: Never  Vaping Use   Vaping Use: Never used  Substance and Sexual Activity   Alcohol use: No   Drug use: No   Sexual activity: Not on file  Other Topics Concern   Not on file  Social History Narrative   Not on file   Social Determinants of Health   Financial Resource Strain: Not on file  Food Insecurity: Not on file  Transportation Needs: Not on file  Physical Activity: Not on file  Stress: Not on file  Social Connections: Not on file     Family History: The patient's family history includes Hyperlipidemia in his father and mother. There is no history of Colon cancer or Prostate cancer.  ROS:   Please see the history of present illness.     All other systems reviewed and are negative.  EKGs/Labs/Other Studies Reviewed:    The following studies were reviewed today:   EKG:  EKG not ordered today.   Recent Labs: 12/31/2020: ALT 18; BUN 15; Creat 1.07; Hemoglobin 15.7; Platelets 289; Potassium 4.3; Sodium 141  Recent Lipid Panel    Component Value Date/Time   CHOL 182 12/31/2020 0833   CHOL 174 10/18/2014  1544   TRIG 69 12/31/2020 0833   HDL 55 12/31/2020 0833   HDL 59 10/18/2014 1544   CHOLHDL 3.3 12/31/2020 0833   VLDL 9 09/09/2016 0922   LDLCALC 111 (H) 12/31/2020 0833     Risk Assessment/Calculations:          Physical Exam:    VS:  BP 112/82 (BP Location: Left Arm, Patient Position: Sitting, Cuff Size: Normal)   Pulse 66   Ht 5\' 9"  (1.753 m)   Wt 176 lb (79.8 kg)   SpO2 97%   BMI 25.99 kg/m     Wt Readings from Last 3 Encounters:  02/08/21 176 lb (79.8 kg)  02/01/21 179 lb (81.2 kg)  12/31/20 180 lb 6.4 oz (81.8 kg)     GEN:  Well nourished, well developed in no acute distress HEENT: Normal NECK: No JVD; No carotid bruits LYMPHATICS: No lymphadenopathy CARDIAC: RRR, no  murmurs, rubs, gallops RESPIRATORY:  Clear to auscultation without rales, wheezing or rhonchi  ABDOMEN: Soft, non-tender, non-distended MUSCULOSKELETAL:  No edema; No deformity  SKIN: Warm and dry NEUROLOGIC:  Alert and oriented x 3 PSYCHIATRIC:  Normal affect   ASSESSMENT:    1. Coronary artery disease involving native coronary artery of native heart, unspecified whether angina present   2. Hyperlipidemia LDL goal <70     PLAN:    In order of problems listed above:  Chest pain, symptoms consistent with angina.  Coronary CTA with significant proximal LAD disease.  Obtain echocardiogram as scheduled.  Plan for left heart cath with possible PCI.  Start aspirin, Lipitor 40 mg daily.  Start nitroglycerin sublingual as needed for antianginal benefit. Hyperlipidemia, LDL goal less than 70, start Lipitor as above   Follow-up after echo and left heart cath.   Shared Decision Making/Informed Consent The risks [stroke (1 in 1000), death (1 in 1000), kidney failure [usually temporary] (1 in 500), bleeding (1 in 200), allergic reaction [possibly serious] (1 in 200)], benefits (diagnostic support and management of coronary artery disease) and alternatives of a cardiac catheterization were discussed in detail with Mr. Burnside and he is willing to proceed.   Medication Adjustments/Labs and Tests Ordered: Current medicines are reviewed at length with the patient today.  Concerns regarding medicines are outlined above.  Orders Placed This Encounter  Procedures   CBC   Basic metabolic panel     Meds ordered this encounter  Medications   aspirin EC 81 MG tablet    Sig: Take 1 tablet (81 mg total) by mouth daily. Swallow whole.    Dispense:  90 tablet    Refill:  3   atorvastatin (LIPITOR) 40 MG tablet    Sig: Take 1 tablet (40 mg total) by mouth daily.    Dispense:  30 tablet    Refill:  5   nitroGLYCERIN (NITROSTAT) 0.4 MG SL tablet    Sig: Place 1 tablet (0.4 mg total) under the tongue  every 5 (five) minutes as needed for chest pain. For a maximum of 3 doses    Dispense:  90 tablet    Refill:  3      Patient Instructions  Medication Instructions:   Your physician has recommended you make the following change in your medication:    START taking Atorvastatin (Lipitor) 40 MG once a day.  2.    START taking Aspirin 81 MG once a day.  3.    START taking Nitroglycerin 0.4 MG under your tongue every 4 mins as needed  for chest pain (Maximum of 3 doses)   *If you need a refill on your cardiac medications before your next appointment, please call your pharmacy*   Lab Work:  Please go to the medical mall for a lab draw (cbc, bmp) after your appointment today.   If you have labs (blood work) drawn today and your tests are completely normal, you will receive your results only by: MyChart Message (if you have MyChart) OR A paper copy in the mail If you have any lab test that is abnormal or we need to change your treatment, we will call you to review the results.   Testing/Procedures:   You are scheduled for a Cardiac Catheterization on Tuesday, September 27 with Dr. Cristal Deer End.  1. Please arrive at the Medical Mall at 6:30 AM (This time is one hour before your procedure to ensure your preparation). Free valet parking service is available.   Special note: Every effort is made to have your procedure done on time. Please understand that emergencies sometimes delay scheduled procedures.  2. Diet: Do not eat solid foods after midnight.  The patient may have clear liquids until 5am upon the day of the procedure.  3. Labs: Drawn on 02/08/21  4. Medication instructions in preparation for your procedure:   Contrast Allergy: No   On the morning of your procedure, take your Aspirin and any morning medicines NOT listed above.  You may use sips of water.  5. Plan for one night stay--bring personal belongings. 6. Bring a current list of your medications and current  insurance cards. 7. You MUST have a responsible person to drive you home. 8. Someone MUST be with you the first 24 hours after you arrive home or your discharge will be delayed. 9. Please wear clothes that are easy to get on and off and wear slip-on shoes.  Thank you for allowing [place] to care for you!   -- [place] [facility]    Follow-Up: At [facility], you and your health needs are our priority.  As part of our continuing mission to provide you with exceptional heart care, we have created designated Provider Care Teams.  These Care Teams include your primary Cardiologist (physician) and Advanced Practice Providers (APPs -  Physician Assistants and Nurse Practitioners) who all work together to provide you with the care you need, when you need it.  We recommend signing up for the patient Christensen called "MyChart".  Sign up information is provided on this After Visit Summary.  MyChart is used to connect with patients for Virtual Visits (Telemedicine).  Patients are able to view lab/test results, encounter notes, upcoming appointments, etc.  Non-urgent messages can be sent to your provider as well.   To learn more about what you can do with MyChart, go to [email or web address].    Your next appointment:   1 month(s)  The format for your next appointment:   In Person  Provider:   Debbe Odea, MD   Other Instructions    Signed, Debbe Odea, MD  02/08/2021 5:10 PM    [facility]

## 2021-02-08 NOTE — Patient Instructions (Addendum)
Medication Instructions:   Your physician has recommended you make the following change in your medication:    START taking Atorvastatin (Lipitor) 40 MG once a day.  2.    START taking Aspirin 81 MG once a day.  3.    START taking Nitroglycerin 0.4 MG under your tongue every 4 mins as needed for chest pain (Maximum of 3 doses)   *If you need a refill on your cardiac medications before your next appointment, please call your pharmacy*   Lab Work:  Please go to the medical mall for a lab draw (cbc, bmp) after your appointment today.   If you have labs (blood work) drawn today and your tests are completely normal, you will receive your results only by: MyChart Message (if you have MyChart) OR A paper copy in the mail If you have any lab test that is abnormal or we need to change your treatment, we will call you to review the results.   Testing/Procedures:   You are scheduled for a Cardiac Catheterization on Tuesday, September 27 with Dr. Cristal Deer End.  1. Please arrive at the Medical Mall at 6:30 AM (This time is one hour before your procedure to ensure your preparation). Free valet parking service is available.   Special note: Every effort is made to have your procedure done on time. Please understand that emergencies sometimes delay scheduled procedures.  2. Diet: Do not eat solid foods after midnight.  The patient may have clear liquids until 5am upon the day of the procedure.  3. Labs: Drawn on 02/08/21  4. Medication instructions in preparation for your procedure:   Contrast Allergy: No   On the morning of your procedure, take your Aspirin and any morning medicines NOT listed above.  You may use sips of water.  5. Plan for one night stay--bring personal belongings. 6. Bring a current list of your medications and current insurance cards. 7. You MUST have a responsible person to drive you home. 8. Someone MUST be with you the first 24 hours after you arrive home or  your discharge will be delayed. 9. Please wear clothes that are easy to get on and off and wear slip-on shoes.  Thank you for allowing [place] to care for you!   -- [place] [facility]    Follow-Up: At [facility], you and your health needs are our priority.  As part of our continuing mission to provide you with exceptional heart care, we have created designated Provider Care Teams.  These Care Teams include your primary Cardiologist (physician) and Advanced Practice Providers (APPs -  Physician Assistants and Nurse Practitioners) who all work together to provide you with the care you need, when you need it.  We recommend signing up for the patient portal called "MyChart".  Sign up information is provided on this After Visit Summary.  MyChart is used to connect with patients for Virtual Visits (Telemedicine).  Patients are able to view lab/test results, encounter notes, upcoming appointments, etc.  Non-urgent messages can be sent to your provider as well.   To learn more about what you can do with MyChart, go to [email or web address].    Your next appointment:   1 month(s)  The format for your next appointment:   In Person  Provider:   Debbe Odea, MD   Other Instructions

## 2021-02-08 NOTE — H&P (View-Only) (Signed)
Cardiology Office Note:    Date:  02/08/2021   ID:  Brinson Philip Godar, DOB 01/31/1971, MRN [id number]  PCP:  Karamalegos, Alexander J, DO   [facility] Providers Cardiologist:  None     Referring MD: Karamalegos, Alexander *   Chief Complaint  Patient presents with   Follow-up    Follow up visit to discuss test results. Medications verbally reviewed with patient.    Junior Philip Rackers is a 50 y.o. male who is being seen today for the evaluation of chest pain at the request of Karamalegos, Alexander *.   History of Present Illness:    Damyn Philip Givler is a 50 y.o. male with no significant past medical history who presents for follow-up.  He was last seen due to chest pain consistent with angina.  His work involves a lot of hiking causing chest discomfort.  Underwent coronary CTA which showed significant proximal LAD disease.  He now presents for follow-up.  States still having symptoms of chest pain with exertion.   Past Medical History:  Diagnosis Date   Cardiac dysrhythmia, unspecified    Other malaise and fatigue    Unspecified congenital anomaly of heart     Past Surgical History:  Procedure Laterality Date   COLONOSCOPY WITH PROPOFOL N/A 01/30/2020   Procedure: COLONOSCOPY WITH PROPOFOL;  Surgeon: Vanga, Rohini Reddy, MD;  Location: [facility] ENDOSCOPY;  Service: Gastroenterology;  Laterality: N/A;    Current Medications: Current Meds  Medication Sig   aspirin EC 81 MG tablet Take 1 tablet (81 mg total) by mouth daily. Swallow whole.   atorvastatin (LIPITOR) 40 MG tablet Take 1 tablet (40 mg total) by mouth daily.   nitroGLYCERIN (NITROSTAT) 0.4 MG SL tablet Place 1 tablet (0.4 mg total) under the tongue every 5 (five) minutes as needed for chest pain. For a maximum of 3 doses     Allergies:   Patient has no known allergies.   Social History   Socioeconomic History   Marital status: Married    Spouse name: Not on file   Number of children: Not on file    Years of education: Not on file   Highest education level: Not on file  Occupational History   Not on file  Tobacco Use   Smoking status: Never   Smokeless tobacco: Never  Vaping Use   Vaping Use: Never used  Substance and Sexual Activity   Alcohol use: No   Drug use: No   Sexual activity: Not on file  Other Topics Concern   Not on file  Social History Narrative   Not on file   Social Determinants of Health   Financial Resource Strain: Not on file  Food Insecurity: Not on file  Transportation Needs: Not on file  Physical Activity: Not on file  Stress: Not on file  Social Connections: Not on file     Family History: The patient's family history includes Hyperlipidemia in his father and mother. There is no history of Colon cancer or Prostate cancer.  ROS:   Please see the history of present illness.     All other systems reviewed and are negative.  EKGs/Labs/Other Studies Reviewed:    The following studies were reviewed today:   EKG:  EKG not ordered today.   Recent Labs: 12/31/2020: ALT 18; BUN 15; Creat 1.07; Hemoglobin 15.7; Platelets 289; Potassium 4.3; Sodium 141  Recent Lipid Panel    Component Value Date/Time   CHOL 182 12/31/2020 0833   CHOL 174 10/18/2014   1544   TRIG 69 12/31/2020 0833   HDL 55 12/31/2020 0833   HDL 59 10/18/2014 1544   CHOLHDL 3.3 12/31/2020 0833   VLDL 9 09/09/2016 0922   LDLCALC 111 (H) 12/31/2020 0833     Risk Assessment/Calculations:          Physical Exam:    VS:  BP 112/82 (BP Location: Left Arm, Patient Position: Sitting, Cuff Size: Normal)   Pulse 66   Ht 5\' 9"  (1.753 m)   Wt 176 lb (79.8 kg)   SpO2 97%   BMI 25.99 kg/m     Wt Readings from Last 3 Encounters:  02/08/21 176 lb (79.8 kg)  02/01/21 179 lb (81.2 kg)  12/31/20 180 lb 6.4 oz (81.8 kg)     GEN:  Well nourished, well developed in no acute distress HEENT: Normal NECK: No JVD; No carotid bruits LYMPHATICS: No lymphadenopathy CARDIAC: RRR, no  murmurs, rubs, gallops RESPIRATORY:  Clear to auscultation without rales, wheezing or rhonchi  ABDOMEN: Soft, non-tender, non-distended MUSCULOSKELETAL:  No edema; No deformity  SKIN: Warm and dry NEUROLOGIC:  Alert and oriented x 3 PSYCHIATRIC:  Normal affect   ASSESSMENT:    1. Coronary artery disease involving native coronary artery of native heart, unspecified whether angina present   2. Hyperlipidemia LDL goal <70     PLAN:    In order of problems listed above:  Chest pain, symptoms consistent with angina.  Coronary CTA with significant proximal LAD disease.  Obtain echocardiogram as scheduled.  Plan for left heart cath with possible PCI.  Start aspirin, Lipitor 40 mg daily.  Start nitroglycerin sublingual as needed for antianginal benefit. Hyperlipidemia, LDL goal less than 70, start Lipitor as above   Follow-up after echo and left heart cath.   Shared Decision Making/Informed Consent The risks [stroke (1 in 1000), death (1 in 1000), kidney failure [usually temporary] (1 in 500), bleeding (1 in 200), allergic reaction [possibly serious] (1 in 200)], benefits (diagnostic support and management of coronary artery disease) and alternatives of a cardiac catheterization were discussed in detail with Mr. Burnside and he is willing to proceed.   Medication Adjustments/Labs and Tests Ordered: Current medicines are reviewed at length with the patient today.  Concerns regarding medicines are outlined above.  Orders Placed This Encounter  Procedures   CBC   Basic metabolic panel     Meds ordered this encounter  Medications   aspirin EC 81 MG tablet    Sig: Take 1 tablet (81 mg total) by mouth daily. Swallow whole.    Dispense:  90 tablet    Refill:  3   atorvastatin (LIPITOR) 40 MG tablet    Sig: Take 1 tablet (40 mg total) by mouth daily.    Dispense:  30 tablet    Refill:  5   nitroGLYCERIN (NITROSTAT) 0.4 MG SL tablet    Sig: Place 1 tablet (0.4 mg total) under the tongue  every 5 (five) minutes as needed for chest pain. For a maximum of 3 doses    Dispense:  90 tablet    Refill:  3      Patient Instructions  Medication Instructions:   Your physician has recommended you make the following change in your medication:    START taking Atorvastatin (Lipitor) 40 MG once a day.  2.    START taking Aspirin 81 MG once a day.  3.    START taking Nitroglycerin 0.4 MG under your tongue every 4 mins as needed  for chest pain (Maximum of 3 doses)   *If you need a refill on your cardiac medications before your next appointment, please call your pharmacy*   Lab Work:  Please go to the medical mall for a lab draw (cbc, bmp) after your appointment today.   If you have labs (blood work) drawn today and your tests are completely normal, you will receive your results only by: MyChart Message (if you have MyChart) OR A paper copy in the mail If you have any lab test that is abnormal or we need to change your treatment, we will call you to review the results.   Testing/Procedures:   You are scheduled for a Cardiac Catheterization on Tuesday, September 27 with Dr. Christopher End.  1. Please arrive at the Medical Mall at 6:30 AM (This time is one hour before your procedure to ensure your preparation). Free valet parking service is available.   Special note: Every effort is made to have your procedure done on time. Please understand that emergencies sometimes delay scheduled procedures.  2. Diet: Do not eat solid foods after midnight.  The patient may have clear liquids until 5am upon the day of the procedure.  3. Labs: Drawn on 02/08/21  4. Medication instructions in preparation for your procedure:   Contrast Allergy: No   On the morning of your procedure, take your Aspirin and any morning medicines NOT listed above.  You may use sips of water.  5. Plan for one night stay--bring personal belongings. 6. Bring a current list of your medications and current  insurance cards. 7. You MUST have a responsible person to drive you home. 8. Someone MUST be with you the first 24 hours after you arrive home or your discharge will be delayed. 9. Please wear clothes that are easy to get on and off and wear slip-on shoes.  Thank you for allowing us to care for you!   -- [place] [facility]    Follow-Up: At [facility], you and your health needs are our priority.  As part of our continuing mission to provide you with exceptional heart care, we have created designated Provider Care Teams.  These Care Teams include your primary Cardiologist (physician) and Advanced Practice Providers (APPs -  Physician Assistants and Nurse Practitioners) who all work together to provide you with the care you need, when you need it.  We recommend signing up for the patient portal called "MyChart".  Sign up information is provided on this After Visit Summary.  MyChart is used to connect with patients for Virtual Visits (Telemedicine).  Patients are able to view lab/test results, encounter notes, upcoming appointments, etc.  Non-urgent messages can be sent to your provider as well.   To learn more about what you can do with MyChart, go to [email or web address].    Your next appointment:   1 month(s)  The format for your next appointment:   In Person  Provider:   Klein Willcox Agbor-Etang, MD   Other Instructions    Signed, Elad Macphail Agbor-Etang, MD  02/08/2021 5:10 PM     [facility]  

## 2021-02-12 ENCOUNTER — Encounter
Admission: RE | Disposition: A | Discharge status: Home / Self Care | Payer: Self-pay | Source: Ambulatory Visit | Attending: Internal Medicine

## 2021-02-12 ENCOUNTER — Other Ambulatory Visit: Payer: Self-pay

## 2021-02-12 ENCOUNTER — Encounter: Payer: Self-pay | Admitting: Internal Medicine

## 2021-02-12 ENCOUNTER — Observation Stay
Admission: RE | Admit: 2021-02-12 | Discharge: 2021-02-13 | Disposition: A | Discharge status: Home / Self Care | Payer: BC Managed Care – PPO | Source: Ambulatory Visit | Attending: Internal Medicine | Admitting: Internal Medicine

## 2021-02-12 DIAGNOSIS — R931 Abnormal findings on diagnostic imaging of heart and coronary circulation: Secondary | ICD-10-CM | POA: Diagnosis present

## 2021-02-12 DIAGNOSIS — I25110 Atherosclerotic heart disease of native coronary artery with unstable angina pectoris: Secondary | ICD-10-CM | POA: Diagnosis not present

## 2021-02-12 DIAGNOSIS — Z7982 Long term (current) use of aspirin: Secondary | ICD-10-CM | POA: Diagnosis not present

## 2021-02-12 DIAGNOSIS — I25118 Atherosclerotic heart disease of native coronary artery with other forms of angina pectoris: Secondary | ICD-10-CM | POA: Diagnosis not present

## 2021-02-12 DIAGNOSIS — I200 Unstable angina: Secondary | ICD-10-CM | POA: Diagnosis present

## 2021-02-12 DIAGNOSIS — R9389 Abnormal findings on diagnostic imaging of other specified body structures: Secondary | ICD-10-CM

## 2021-02-12 DIAGNOSIS — Z20822 Contact with and (suspected) exposure to covid-19: Secondary | ICD-10-CM | POA: Insufficient documentation

## 2021-02-12 DIAGNOSIS — Z79899 Other long term (current) drug therapy: Secondary | ICD-10-CM | POA: Insufficient documentation

## 2021-02-12 DIAGNOSIS — I2510 Atherosclerotic heart disease of native coronary artery without angina pectoris: Secondary | ICD-10-CM

## 2021-02-12 DIAGNOSIS — E785 Hyperlipidemia, unspecified: Secondary | ICD-10-CM

## 2021-02-12 DIAGNOSIS — R079 Chest pain, unspecified: Secondary | ICD-10-CM | POA: Diagnosis not present

## 2021-02-12 HISTORY — PX: CORONARY STENT INTERVENTION: CATH118234

## 2021-02-12 HISTORY — PX: LEFT HEART CATH AND CORONARY ANGIOGRAPHY: CATH118249

## 2021-02-12 LAB — POCT ACTIVATED CLOTTING TIME
Activated Clotting Time: 225 seconds
Activated Clotting Time: 248 seconds
Activated Clotting Time: 271 seconds
Activated Clotting Time: 300 seconds

## 2021-02-12 LAB — SARS CORONAVIRUS 2 (TAT 6-24 HRS): SARS Coronavirus 2: NEGATIVE

## 2021-02-12 SURGERY — LEFT HEART CATH AND CORONARY ANGIOGRAPHY
Anesthesia: Moderate Sedation

## 2021-02-12 MED ORDER — HEPARIN SODIUM (PORCINE) 1000 UNIT/ML IJ SOLN
INTRAMUSCULAR | Status: DC | PRN
  Administered 2021-02-12: 4500 [IU] via INTRAVENOUS
  Administered 2021-02-12 (×2): 3000 [IU] via INTRAVENOUS
  Administered 2021-02-12: 2000 [IU] via INTRAVENOUS
  Administered 2021-02-12: 4000 [IU] via INTRAVENOUS

## 2021-02-12 MED ORDER — LIDOCAINE HCL (PF) 1 % IJ SOLN
INTRAMUSCULAR | Status: DC | PRN
  Administered 2021-02-12: 2 mL

## 2021-02-12 MED ORDER — ASPIRIN 81 MG PO CHEW: 81.0000 mg | CHEWABLE_TABLET | ORAL | Status: DC

## 2021-02-12 MED ORDER — SODIUM CHLORIDE 0.9 % IV SOLN: INTRAVENOUS | Status: AC

## 2021-02-12 MED ORDER — HYDRALAZINE HCL 20 MG/ML IJ SOLN: 10.0000 mg | INTRAMUSCULAR | Status: AC | PRN

## 2021-02-12 MED ORDER — HEPARIN (PORCINE) IN NACL 1000-0.9 UT/500ML-% IV SOLN
INTRAVENOUS | Status: AC
  Filled 2021-02-12: qty 1000

## 2021-02-12 MED ORDER — HEPARIN (PORCINE) IN NACL 2000-0.9 UNIT/L-% IV SOLN
INTRAVENOUS | Status: DC | PRN
  Administered 2021-02-12: 1000 mL

## 2021-02-12 MED ORDER — FENTANYL CITRATE (PF) 100 MCG/2ML IJ SOLN
INTRAMUSCULAR | Status: DC | PRN
  Administered 2021-02-12 (×2): 25 ug via INTRAVENOUS

## 2021-02-12 MED ORDER — VERAPAMIL HCL 2.5 MG/ML IV SOLN
INTRAVENOUS | Status: AC
  Filled 2021-02-12: qty 2

## 2021-02-12 MED ORDER — MIDAZOLAM HCL 2 MG/2ML IJ SOLN
INTRAMUSCULAR | Status: DC | PRN
  Administered 2021-02-12 (×2): 1 mg via INTRAVENOUS

## 2021-02-12 MED ORDER — HEPARIN SODIUM (PORCINE) 1000 UNIT/ML IJ SOLN
INTRAMUSCULAR | Status: AC
  Filled 2021-02-12: qty 1

## 2021-02-12 MED ORDER — IOHEXOL 350 MG/ML SOLN
INTRAVENOUS | Status: DC | PRN
  Administered 2021-02-12: 109 mL via INTRA_ARTERIAL

## 2021-02-12 MED ORDER — SODIUM CHLORIDE 0.9% FLUSH: 3.0000 mL | INTRAVENOUS | Status: DC | PRN

## 2021-02-12 MED ORDER — SODIUM CHLORIDE 0.9 % IV SOLN: 250.0000 mL | INTRAVENOUS | Status: DC | PRN

## 2021-02-12 MED ORDER — LABETALOL HCL 5 MG/ML IV SOLN: 10.0000 mg | INTRAVENOUS | Status: AC | PRN

## 2021-02-12 MED ORDER — CLOPIDOGREL BISULFATE 300 MG PO TABS
ORAL_TABLET | ORAL | Status: AC
  Filled 2021-02-12: qty 2

## 2021-02-12 MED ORDER — CLOPIDOGREL BISULFATE 75 MG PO TABS
ORAL_TABLET | ORAL | Status: DC | PRN
  Administered 2021-02-12: 600 mg via ORAL

## 2021-02-12 MED ORDER — SODIUM CHLORIDE 0.9% FLUSH: 3.0000 mL | Freq: Two times a day (BID) | INTRAVENOUS | Status: DC

## 2021-02-12 MED ORDER — FENTANYL CITRATE (PF) 100 MCG/2ML IJ SOLN
INTRAMUSCULAR | Status: AC
  Filled 2021-02-12: qty 2

## 2021-02-12 MED ORDER — CLOPIDOGREL BISULFATE 75 MG PO TABS
75.0000 mg | ORAL_TABLET | Freq: Every day | ORAL | Status: DC
  Administered 2021-02-13: 75 mg via ORAL
  Filled 2021-02-12: qty 1

## 2021-02-12 MED ORDER — SODIUM CHLORIDE 0.9 % WEIGHT BASED INFUSION: 1.0000 mL/kg/h | INTRAVENOUS | Status: DC

## 2021-02-12 MED ORDER — SODIUM CHLORIDE 0.9 % WEIGHT BASED INFUSION
3.0000 mL/kg/h | INTRAVENOUS | Status: DC
  Administered 2021-02-12: 3 mL/kg/h via INTRAVENOUS

## 2021-02-12 MED ORDER — NITROGLYCERIN 1 MG/10 ML FOR IR/CATH LAB
INTRA_ARTERIAL | Status: DC | PRN
  Administered 2021-02-12 (×2): 100 ug via INTRACORONARY

## 2021-02-12 MED ORDER — ENOXAPARIN SODIUM 40 MG/0.4ML IJ SOSY: 40.0000 mg | PREFILLED_SYRINGE | INTRAMUSCULAR | Status: DC

## 2021-02-12 MED ORDER — VERAPAMIL HCL 2.5 MG/ML IV SOLN
INTRAVENOUS | Status: DC | PRN
  Administered 2021-02-12: 2.5 mg via INTRA_ARTERIAL

## 2021-02-12 MED ORDER — MIDAZOLAM HCL 2 MG/2ML IJ SOLN
INTRAMUSCULAR | Status: AC
  Filled 2021-02-12: qty 2

## 2021-02-12 MED ORDER — ASPIRIN EC 81 MG PO TBEC
81.0000 mg | DELAYED_RELEASE_TABLET | Freq: Every day | ORAL | Status: DC
  Administered 2021-02-13: 81 mg via ORAL
  Filled 2021-02-12: qty 1

## 2021-02-12 MED ORDER — LIDOCAINE HCL 1 % IJ SOLN
INTRAMUSCULAR | Status: AC
  Filled 2021-02-12: qty 20

## 2021-02-12 MED ORDER — NITROGLYCERIN 0.4 MG SL SUBL: 0.4000 mg | SUBLINGUAL_TABLET | SUBLINGUAL | Status: DC | PRN

## 2021-02-12 MED ORDER — ONDANSETRON HCL 4 MG/2ML IJ SOLN: 4.0000 mg | Freq: Four times a day (QID) | INTRAMUSCULAR | Status: DC | PRN

## 2021-02-12 MED ORDER — ACETAMINOPHEN 325 MG PO TABS: 650.0000 mg | ORAL_TABLET | ORAL | Status: DC | PRN

## 2021-02-12 MED ORDER — SODIUM CHLORIDE 0.9% FLUSH
3.0000 mL | Freq: Two times a day (BID) | INTRAVENOUS | Status: DC
  Administered 2021-02-12: 3 mL via INTRAVENOUS

## 2021-02-12 MED ORDER — ATORVASTATIN CALCIUM 20 MG PO TABS
40.0000 mg | ORAL_TABLET | Freq: Every day | ORAL | Status: DC
  Administered 2021-02-12 – 2021-02-13 (×2): 40 mg via ORAL
  Filled 2021-02-12 (×2): qty 2
  Filled 2021-02-12 (×2): qty 1

## 2021-02-12 SURGICAL SUPPLY — 25 items
BALLN TREK RX 2.5X8 (BALLOONS) ×3
BALLN ~~LOC~~ TREK RX 3.5X12 (BALLOONS) ×3
BALLOON TREK RX 2.5X8 (BALLOONS) ×2 IMPLANT
BALLOON ~~LOC~~ TREK RX 3.5X12 (BALLOONS) ×2 IMPLANT
CATH 5F 110X4 TIG (CATHETERS) ×3 IMPLANT
CATH EAGLE EYE PLAT IMAGING (CATHETERS) ×3 IMPLANT
CATH INFINITI 5 FR 3DRC (CATHETERS) ×3 IMPLANT
CATH INFINITI 5 FR JL3.5 (CATHETERS) ×3 IMPLANT
CATH INFINITI 5 FR MPA2 (CATHETERS) ×3 IMPLANT
CATH INFINITI 5FR AL1 (CATHETERS) ×3 IMPLANT
CATH INFINITI JR4 5F (CATHETERS) ×3 IMPLANT
CATH VISTA GUIDE 6FR XB3 (CATHETERS) ×3 IMPLANT
DEVICE RAD TR BAND REGULAR (VASCULAR PRODUCTS) ×3 IMPLANT
DRAPE BRACHIAL (DRAPES) ×3 IMPLANT
GLIDESHEATH SLEND SS 6F .021 (SHEATH) ×3 IMPLANT
GUIDEWIRE INQWIRE 1.5J.035X260 (WIRE) ×2 IMPLANT
INQWIRE 1.5J .035X260CM (WIRE) ×3
KIT ENCORE 26 ADVANTAGE (KITS) ×3 IMPLANT
KIT SYRINGE INJ CVI SPIKEX1 (MISCELLANEOUS) ×3 IMPLANT
PACK CARDIAC CATH (CUSTOM PROCEDURE TRAY) ×3 IMPLANT
PROTECTION STATION PRESSURIZED (MISCELLANEOUS) ×3
SET ATX SIMPLICITY (MISCELLANEOUS) ×3 IMPLANT
STATION PROTECTION PRESSURIZED (MISCELLANEOUS) ×2 IMPLANT
STENT ONYX FRONTIER 3.0X15 (Permanent Stent) ×3 IMPLANT
WIRE RUNTHROUGH .014X180CM (WIRE) ×3 IMPLANT

## 2021-02-12 NOTE — Interval H&P Note (Signed)
History and Physical Interval Note:  02/12/2021 7:49 AM  John Christensen  has presented today for surgery, with the diagnosis of accelerating angina and abnormal cardiac CTA.  The various methods of treatment have been discussed with the patient and family. After consideration of risks, benefits and other options for treatment, the patient has consented to  Procedure(s): LEFT HEART CATH AND CORONARY ANGIOGRAPHY (Left) as a surgical intervention.  The patient's history has been reviewed, patient examined, no change in status, stable for surgery.  I have reviewed the patient's chart and labs.  Questions were answered to the patient's satisfaction.    Cath Lab Visit (complete for each Cath Lab visit)  Clinical Evaluation Leading to the Procedure:   ACS: No.  Non-ACS:    Anginal Classification: CCS II  Anti-ischemic medical therapy: No Therapy  Non-Invasive Test Results: Abnormal cardiac CTA with hemodynamically significant proximal LAD stenosis by CTA  Prior CABG: No previous CABG  Padraig Nhan

## 2021-02-12 NOTE — Discharge Instructions (Signed)
   Please review your medication list closely as this has changed following your recent hospital admission.  Should you have any questions regarding your medications, or have difficulty in getting them filled, please contact our office at [phone].

## 2021-02-13 ENCOUNTER — Observation Stay (HOSPITAL_BASED_OUTPATIENT_CLINIC_OR_DEPARTMENT_OTHER)
Admission: RE | Admit: 2021-02-13 | Discharge: 2021-02-13 | Disposition: A | Discharge status: Home / Self Care | Payer: BC Managed Care – PPO | Source: Ambulatory Visit | Attending: Internal Medicine | Admitting: Internal Medicine

## 2021-02-13 DIAGNOSIS — Z7982 Long term (current) use of aspirin: Secondary | ICD-10-CM | POA: Diagnosis not present

## 2021-02-13 DIAGNOSIS — R079 Chest pain, unspecified: Secondary | ICD-10-CM | POA: Diagnosis not present

## 2021-02-13 DIAGNOSIS — I2510 Atherosclerotic heart disease of native coronary artery without angina pectoris: Secondary | ICD-10-CM | POA: Diagnosis not present

## 2021-02-13 DIAGNOSIS — Z20822 Contact with and (suspected) exposure to covid-19: Secondary | ICD-10-CM | POA: Diagnosis not present

## 2021-02-13 DIAGNOSIS — Z79899 Other long term (current) drug therapy: Secondary | ICD-10-CM | POA: Diagnosis not present

## 2021-02-13 DIAGNOSIS — I200 Unstable angina: Secondary | ICD-10-CM | POA: Diagnosis not present

## 2021-02-13 DIAGNOSIS — E785 Hyperlipidemia, unspecified: Secondary | ICD-10-CM

## 2021-02-13 DIAGNOSIS — I25118 Atherosclerotic heart disease of native coronary artery with other forms of angina pectoris: Secondary | ICD-10-CM | POA: Diagnosis not present

## 2021-02-13 LAB — BASIC METABOLIC PANEL
Anion gap: 6 (ref 5–15)
BUN: 31 mg/dL — ABNORMAL HIGH (ref 6–20)
CO2: 29 mmol/L (ref 22–32)
Calcium: 9.2 mg/dL (ref 8.9–10.3)
Chloride: 104 mmol/L (ref 98–111)
Creatinine, Ser: 1.06 mg/dL (ref 0.61–1.24)
GFR, Estimated: >60 mL/min (ref >60)
Glucose, Bld: 98 mg/dL (ref 70–99)
Potassium: 4.3 mmol/L (ref 3.5–5.1)
Sodium: 139 mmol/L (ref 135–145)

## 2021-02-13 LAB — ECHOCARDIOGRAM COMPLETE
AR max vel: 2.45 cm2
AV Area VTI: 2.8 cm2
AV Area mean vel: 2.66 cm2
AV Mean grad: 2 mmHg
AV Peak grad: 3.6 mmHg
Ao pk vel: 0.95 m/s
Area-P 1/2: 4.49 cm2
Height: 69 in
S' Lateral: 3 cm
Weight: 2872 oz

## 2021-02-13 LAB — CBC
HCT: 41.9 % (ref 39.0–52.0)
Hemoglobin: 14.3 g/dL (ref 13.0–17.0)
MCH: 28.9 pg (ref 26.0–34.0)
MCHC: 34.1 g/dL (ref 30.0–36.0)
MCV: 84.6 fL (ref 80.0–100.0)
Platelets: 251 10*3/uL (ref 150–400)
RBC: 4.95 MIL/uL (ref 4.22–5.81)
RDW: 12.4 % (ref 11.5–15.5)
WBC: 8.6 10*3/uL (ref 4.0–10.5)
nRBC: 0 % (ref 0.0–0.2)

## 2021-02-13 MED ORDER — CLOPIDOGREL BISULFATE 75 MG PO TABS: 75.0000 mg | ORAL_TABLET | Freq: Every day | ORAL | 3 refills | Status: DC

## 2021-02-13 NOTE — Progress Notes (Signed)
*  PRELIMINARY RESULTS* Echocardiogram 2D Echocardiogram has been performed.  Cristela Blue 02/13/2021, 10:50 AM

## 2021-02-13 NOTE — Discharge Summary (Addendum)
Discharge Summary    Patient ID: John Christensen  MRN: [id number], DOB/AGE: 06-11-1970 50 y.o.  Admit Date: 02/12/2021 Discharge Date: 02/13/2021  Primary Care Provider: Olin Hauser, DO Primary Cardiologist: Dr. Garen Lah, MD  Discharge Diagnoses    Principal Problem:   Accelerating angina [facility]) Active Problems:   Abnormal cardiac CT angiography   CAD in native artery   Hyperlipidemia LDL goal <70   Allergies No Known Allergies   History of Present Illness     50 year old male with history of recently diagnosed CAD status post PCI to the LAD as outlined below and HLD who presented to [facility] on 02/12/2021 for scheduled outpatient diagnostic LHC.  He was previously evaluated by Dr. Fletcher Anon in 2015 for palpitations.  48 hour Holter was performed and showed sinus rhythm with sinus arrhythmia and rare PACs.  No significant arrhythmia was identified.  More recently, he establish care with Dr. Garen Lah on 02/01/2021 with complaints of exertional chest pain.  Subsequent coronary CTA showed significant stenosis within the proximal LAD with an FFRct of 0.76.  Given this finding, it was recommended he undergo diagnostic [place]     Consultants: Cardiac rehab    He presented to [facility] on 02/12/2021 for diagnostic LHC which showed severe single-vessel CAD with 80 to 90% ostial/proximal LAD stenosis and 40 to 50% mid LAD stenosis.  There was no significant disease noted in the LCx or RCA.  Normal LV systolic function and filling pressure was noted.  He underwent successful IVUS guided PCI to the ostial/proximal LAD using an Onyx frontier 3.0 x 15 mm drug-eluting stent with 0% residual stenosis and TIMI-3 flow.  Overnight extended recovery observation was recommended along with DAPT with aspirin and clopidogrel for at least 6 months.  Post-cath vitals and labs remained stable.  He has ambulated without symptoms of angina.   CAD involving the native coronary arteries:   -Chest pain-free  -Continue DAPT without interruption with ASA and clopidogrel for at least 6 months  -Scheduled for outpatient echo -Atorvastatin  -Defer addition of beta-blocker secondary to relative hypotension and sinus bradycardia on telemetry -As needed SL NTG  -Post-cath instructions  -Cardiac rehab   HLD:  -LDL 111 in 12/2020 with goal being less than 70  -Now on atorvastatin 40 mg daily, as of 02/08/2021  -Recommend follow up fasting lipid panel and LFT in approximately 8 weeks with recommendation to escalate lipid therapy as indicated to achieve target    The patient's right radial cardiac cath site has been examined is healing well without issues at this time. The patient has been seen by Dr. Garen Lah, MD and felt to be stable for discharge today. All follow up appointments have been made. Discharge medications are listed below. Prescriptions have been reviewed with the patient and sent in to their pharmacy.  _____________  Discharge Vitals Blood pressure 109/74, pulse 61, temperature (!) 96.9 F (36.1 C), resp. rate 18, height 5' 9"  (1.753 m), weight 81.4 kg, SpO2 100 %.  Filed Weights   02/12/21 0655 02/12/21 1329  Weight: 82.6 kg 81.4 kg   Physical Exam   GEN: No acute distress.   Neck: No JVD. Cardiac: RRR, no murmurs, rubs, or gallops. Right radial cardiac cath site is bandaged with small bruise noted proximally to the arteriotomy site. No active bleeding, swelling, warmth, erythema, or TTP. Radial pulse 2+ proximally and distally to the arteriotomy site.  Respiratory: Clear to auscultation bilaterally.  GI: Soft, nontender, non-distended.  MS: No edema; No deformity. Neuro:  Alert and oriented x 3; Nonfocal.  Psych: Normal affect.  Labs & Radiologic Studies    CBC Recent Labs    02/13/21 0404  WBC 8.6  HGB 14.3  HCT 41.9  MCV 84.6  PLT 546   Basic Metabolic Panel Recent Labs    02/13/21 0404  NA 139  K 4.3  CL 104  CO2 29  GLUCOSE 98  BUN  31*  CREATININE 1.06  CALCIUM 9.2   Liver Function Tests No results for input(s): AST, ALT, ALKPHOS, BILITOT, PROT, ALBUMIN in the last 72 hours. No results for input(s): LIPASE, AMYLASE in the last 72 hours. Cardiac Enzymes No results for input(s): CKTOTAL, CKMB, CKMBINDEX, TROPONINI in the last 72 hours. BNP Invalid input(s): POCBNP D-Dimer No results for input(s): DDIMER in the last 72 hours. Hemoglobin A1C No results for input(s): HGBA1C in the last 72 hours. Fasting Lipid Panel No results for input(s): CHOL, HDL, LDLCALC, TRIG, CHOLHDL, LDLDIRECT in the last 72 hours. Thyroid Function Tests No results for input(s): TSH, T4TOTAL, T3FREE, THYROIDAB in the last 72 hours.  Invalid input(s): FREET3 _____________   CT CORONARY MORPH W/CTA COR W/SCORE W/CA W/CM &/OR WO/CM  Addendum Date: 02/07/2021   ADDENDUM REPORT: 02/07/2021 16:20 EXAM: OVER-READ INTERPRETATION  CT CHEST The following report is an over-read performed by radiologist Dr. Abigail Miyamoto of [facility] [place] on 02/07/2021. This over-read does not include interpretation of cardiac or coronary anatomy or pathology. The coronary CTA interpretation by the cardiologist is attached. COMPARISON:  12/12/2020 chest radiograph. FINDINGS: Vascular: Aortic atherosclerosis. No central pulmonary embolism, on this non-dedicated study. Mediastinum/Nodes: No imaged thoracic adenopathy. Lungs/Pleura: No pleural fluid. Marked left hemidiaphragm elevation. Left lung base compressive atelectasis. Upper Abdomen: Normal imaged portions of the liver, spleen, stomach. Musculoskeletal: No acute osseous abnormality. IMPRESSION: 1.  No acute findings in the imaged extracardiac chest. 2.  Aortic Atherosclerosis (ICD10-I70.0). 3. Marked left hemidiaphragm elevation with adjacent left base volume loss. Electronically Signed   By: Abigail Miyamoto M.D.   On: 02/07/2021 16:20   Result Date: 02/07/2021 CLINICAL DATA:  Chest pain EXAM: Cardiac/Coronary  CTA  TECHNIQUE: The patient was scanned on a Siemens Somatom go.Top scanner. : A retrospective scan was triggered in the descending thoracic aorta. Axial non-contrast 3 mm slices were carried out through the heart. The data set was analyzed on a dedicated work station and scored using the [place]. Gantry rotation speed was 330 msecs and collimation was .6 mm. 37m of metoprolol and 0.8 mg of sl NTG was given. The 3D data set was reconstructed in 5% intervals of the 60-95 % of the R-R cycle. Diastolic phases were analyzed on a dedicated work station using MPR, MIP and VRT modes. The patient received 75 cc of contrast. FINDINGS: Aorta:  Normal size.  No calcifications.  No dissection. Aortic Valve:  Trileaflet.  No calcifications. Coronary Arteries:  Normal coronary origin.  Right dominance. RCA is a dominant artery that gives rise to PDA and PLA. There is no plaque. Left main is a large artery that gives rise to LAD and LCX arteries. LM is free of disease LAD has calcified and noncalcified plaque in the proximal segment causing severe (>70%) stenosis. LCX is a non-dominant artery that gives rise to two obtuse marginal branches. There is no plaque. Other findings: Normal pulmonary vein drainage into the left atrium. Normal left atrial appendage without a thrombus. Normal size of the pulmonary artery. IMPRESSION: 1. Coronary  calcium score of 118. This was 89th percentile for age and sex matched control. 2. Normal coronary origin with right dominance. 3. Calcified and non calcified plaque causing severe stenosis(>70%) in the proximal LAD. 4. CAD-RADS 4 Severe stenosis. (70-99% or > 50% left main). Cardiac catheterization is recommended. Additional analysis with CT FFR will be submitted and reported separately. 5. Consider symptom-guided anti-ischemic pharmacotherapy as well as risk factor modification per guideline directed care. Electronically Signed: By: Kate Sable M.D. On: 02/07/2021 14:45   CT CORONARY  FRACTIONAL FLOW RESERVE DATA PREP  Result Date: 02/08/2021 EXAM: CT FFR ANALYSIS CLINICAL DATA:  Abnormal CCTA FINDINGS: FFRct analysis was performed on the original cardiac CT angiogram dataset. Diagrammatic representation of the FFRct analysis is provided in a separate PDF document in PACS. This dictation was created using the PDF document and an interactive 3D model of the results. 3D model is not available in the EMR/PACS. Normal FFR range is >0.80. 1. Left Main:  No significant stenosis. 2. LAD: significant stenosis in the proximal LAD.  FFRct 0.76 3. LCX: No significant stenosis. 4. RCA: No significant stenosis. IMPRESSION: 1.  CT FFR analysis showed significant stenosis in the proximal LAD. 2.  Recommend cardiac catheterization. Electronically Signed   By: Kate Sable M.D.   On: 02/08/2021 11:21    Diagnostic Studies/Procedures   LHC 02/12/2021: Conclusions: Severe single-vessel coronary artery disease with 80-90% ostial/proximal LAD stenosis (minimal luminal area 3.6 mm by IVUS) and 40-50% mid LAD stenosis (minimal luminal area 4.4 mm by IVUS).  No significant disease noted in the LCx and RCA. Normal left ventricular systolic function and filling pressure. Successful IVUS-guided PCI to the ostial/proximal LAD using Onyx frontier 3.0 x 15 mm drug-eluting stent (postdilated to 3.6 mm) with 0% residual stenosis and TIMI-3 flow.   Recommendations: Overnight extended recovery. Dual antiplatelet therapy with aspirin and clopidogrel for at least 6 months. Aggressive secondary prevention.   Diagnostic Dominance: Right Left Main  Vessel is large. Vessel is angiographically normal.  Left Anterior Descending  Vessel is large.  Ost LAD to Prox LAD lesion is 85% stenosed. Ultrasound (IVUS) was performed. Cross-sectional area: 3.6 mm. Severe plaque burden was detected. IVUS has determined that the lesion is heterogeneous.  Mid LAD lesion is 45% stenosed. Ultrasound (IVUS) was performed.  Cross-sectional area: 4.4 mm. Moderate plaque burden was detected. IVUS has determined that the lesion is heterogeneous.  First Diagonal Branch  Vessel is small in size.  Second Diagonal Branch  Vessel is moderate in size.  Third Diagonal Branch  Vessel is moderate in size.  Left Anterior Descending  Vessel is large.  Ost LAD to Prox LAD lesion is 85% stenosed. Ultrasound (IVUS) was performed. Cross-sectional area: 3.6 mm. Severe plaque burden was detected. IVUS has determined that the lesion is heterogeneous.  Mid LAD lesion is 45% stenosed. Ultrasound (IVUS) was performed. Cross-sectional area: 4.4 mm. Moderate plaque burden was detected. IVUS has determined that the lesion is heterogeneous.  First Diagonal Branch  Vessel is small in size.  Second Diagonal Branch  Vessel is moderate in size.  Third Diagonal Branch  Vessel is moderate in size.  Left Circumflex  Vessel is large. Vessel is angiographically normal.  First Obtuse Marginal Branch  Vessel is small in size.  Second Obtuse Marginal Branch  Vessel is moderate in size.  Third Obtuse Marginal Branch  Vessel is moderate in size.  Right Coronary Artery  Vessel is moderate in size. Vessel is angiographically normal.  Right Posterior  Descending Artery  Vessel is moderate in size.  Right Posterior Atrioventricular Artery  Vessel is moderate in size.  First Right Posterolateral Branch  Vessel is small in size.  Second Right Posterolateral Branch  Vessel is small in size.  Third Right Posterolateral Branch  Vessel is small in size.  Intervention  Ost LAD to Prox LAD lesion  Stent  Lesion length: 12 mm. CATH VISTA GUIDE 6FR XB3 guide catheter was inserted. Lesion crossed with guidewire using a WIRE RUNTHROUGH .[id number]. Pre-stent angioplasty was performed using a BALLN TREK RX 2.5X8. Maximum pressure: 12 atm. A drug-eluting stent was successfully placed using a STENT ONYX FRONTIER 3.0X15. Maximum pressure: 18 atm. Stent  strut is well apposed. Post-stent angioplasty was performed using a BALLN [place] RX 3.5X12. Maximum pressure: 18 atm.  Post-Intervention Lesion Assessment  The intervention was successful. Pre-interventional TIMI flow is 3. Post-intervention TIMI flow is 3. No complications occurred at this lesion. Ultrasound (IVUS) was performed on the lesion post PCI. Stent well apposed.  There is a 0% residual stenosis post intervention.  Ost LAD to Prox LAD lesion  Stent  See details in Ost LAD to Prox LAD lesion.  Post-Intervention Lesion Assessment  See details in Ost LAD to Prox LAD lesion.  There is a 0% residual stenosis post intervention.   Wall Motion              Left Heart  Left Ventricle The left ventricular size is normal. The left ventricular systolic function is normal. LV end diastolic pressure is normal. LVEDP ~10 mmHg. The left ventricular ejection fraction is 55-65% by visual estimate. No regional wall motion abnormalities.  Aortic Valve There is no aortic valve stenosis.   Coronary Diagrams  Diagnostic Dominance: Right Intervention   _____________  Disposition   Pt is being discharged home today in good condition.  Follow-up Plans & Appointments     Follow-up Information     Rise Mu, PA-C Follow up on 02/25/2021.   Specialties: Physician Assistant, Cardiology, Radiology Why: Appointment time 10 AM Contact information: [address] [place] [address] [phone]                Discharge Instructions     AMB Referral to Cardiac Rehabilitation - Phase II   Complete by: As directed    Diagnosis: Coronary Stents   After initial evaluation and assessments completed: Virtual Based Care may be provided alone or in conjunction with Phase 2 Cardiac Rehab based on patient barriers.: Yes   Diet - low sodium heart healthy   Complete by: As directed    Increase activity slowly   Complete by: As directed        Discharge  Medications   Allergies as of 02/13/2021   No Known Allergies      Medication List     STOP taking these medications    ibuprofen 200 MG tablet Commonly known as: ADVIL       TAKE these medications    acetaminophen 500 MG tablet Commonly known as: TYLENOL Take 500-1,000 mg by mouth every 6 (six) hours as needed for moderate pain.   albuterol 108 (90 Base) MCG/ACT inhaler Commonly known as: VENTOLIN HFA Inhale 2 puffs into the lungs every 6 (six) hours as needed for wheezing or shortness of breath.   aspirin EC 81 MG tablet Take 1 tablet (81 mg total) by mouth daily. Swallow whole.   atorvastatin 40 MG tablet Commonly known as: LIPITOR  Take 1 tablet (40 mg total) by mouth daily.   clopidogrel 75 MG tablet Commonly known as: PLAVIX Take 1 tablet (75 mg total) by mouth daily with breakfast.   nitroGLYCERIN 0.4 MG SL tablet Commonly known as: NITROSTAT Place 1 tablet (0.4 mg total) under the tongue every 5 (five) minutes as needed for chest pain. For a maximum of 3 doses         Aspirin prescribed at discharge?  Yes High Intensity Statin Prescribed? (Lipitor 40-82m or Crestor 20-474m: Yes Beta Blocker Prescribed? No: Relative hypotension and sinus bradycardia in the 50s bpm For EF <40%, was ACEI/ARB Prescribed? No: EF greater than 40% by LV gram and relative hypotension ADP Receptor Inhibitor Prescribed? (i.e. Plavix etc.-Includes Medically Managed Patients): Yes For EF <40%, Aldosterone Inhibitor Prescribed? No: EF greater than 40% Was EF assessed during THIS hospitalization? Yes Was Cardiac Rehab II ordered? (Included Medically managed Patients): Yes   Outstanding Labs/Studies   None.  Duration of Discharge Encounter   Greater than 30 minutes including physician time.  Signed, RyRise MuPA-C [facility] Pager: [phone]/28/2022, 8:21 AM

## 2021-02-22 ENCOUNTER — Encounter: Payer: BC Managed Care – PPO | Attending: Internal Medicine | Admitting: *Deleted

## 2021-02-22 ENCOUNTER — Other Ambulatory Visit: Payer: Self-pay

## 2021-02-22 DIAGNOSIS — Z955 Presence of coronary angioplasty implant and graft: Secondary | ICD-10-CM | POA: Insufficient documentation

## 2021-02-22 NOTE — Progress Notes (Signed)
Initial telephone orientation completed. Diagnosis can be found in [facility] 9/27. EP orientation scheduled for Tuesday 10/18 at 8am.

## 2021-02-22 NOTE — Progress Notes (Signed)
Cardiology Office Note    Date:  02/25/2021   ID:  John Christensen, DOB Jan 01, 1971, MRN [id number]  PCP:  Smitty Cords, DO  Cardiologist:  Debbe Odea, MD  Electrophysiologist:  None   Chief Complaint: Hospital follow-up  History of Present Illness:   John Christensen is a 50 y.o. male with history of recently diagnosed CAD status post PCI to the LAD as outlined below and HLD who presents for hospital follow-up after scheduled diagnostic LHC.  He was previously evaluated by Dr. Kirke Corin in 2015 for palpitations.  48 hour Holter was performed and showed sinus rhythm with sinus arrhythmia and rare PACs.  No significant arrhythmia was identified.  More recently, he established care with Dr. Azucena Cecil on 02/01/2021 with complaints of exertional chest pain.  Subsequent coronary CTA showed significant stenosis within the proximal LAD with an FFRct of 0.76.  Given this finding, it was recommended he undergo diagnostic LHC, which showed severe single-vessel CAD with 80 to 90% ostial/proximal LAD stenosis and 40 to 50% mid LAD stenosis.  There was no significant disease noted in the LCx or RCA.  Normal LV systolic function and filling pressure was noted.  He underwent successful IVUS guided PCI to the ostial/proximal LAD using an Onyx Frontier 3.0 x 15 mm drug-eluting stent with 0% residual stenosis and TIMI-3 flow.  Previously recommended outpatient echo was performed during admission which showed an EF of 55 to 60%, no regional wall motion abnormalities, normal LV diastolic function parameters, normal RV systolic function, no significant valvular abnormalities, and an estimated right atrial pressure of 3 mmHg.  He comes in doing well from a cardiac perspective.  No angina, dyspnea, palpitations, dizziness, presyncope, or syncope.  He is back to work, mostly doing light duty, though did walk approximately 30 acres at the end of last week without cardiac limitation.  He did trip and fall  last week, though did not hit his head or suffer LOC.  No issues from his right radial arteriotomy site.  No hematochezia, melena, hemoptysis, hematemesis, or hematuria.  He is tolerating cardiac medications without issues.  He would like to resume full duties at work.  He will be participating in cardiac rehab.   Labs independently reviewed: 01/2021 - Hgb 14.3, PLT 251, potassium 4.3, BUN 31, serum creatinine 1.06 12/2020 - A1c 4.7, TC 182, TG 69, HDL 55, LDL 111, albumin 4.3, AST/ALT normal 09/2017 - TSH normal  Past Medical History:  Diagnosis Date   Cardiac dysrhythmia, unspecified    Other malaise and fatigue    Unspecified congenital anomaly of heart     Past Surgical History:  Procedure Laterality Date   COLONOSCOPY WITH PROPOFOL N/A 01/30/2020   Procedure: COLONOSCOPY WITH PROPOFOL;  Surgeon: Toney Reil, MD;  Location: [facility] ENDOSCOPY;  Service: Gastroenterology;  Laterality: N/A;   CORONARY STENT INTERVENTION N/A 02/12/2021   Procedure: CORONARY STENT INTERVENTION;  Surgeon: Yvonne Kendall, MD;  Location: [facility] INVASIVE CV LAB;  Service: Cardiovascular;  Laterality: N/A;   LEFT HEART CATH AND CORONARY ANGIOGRAPHY Left 02/12/2021   Procedure: LEFT HEART CATH AND CORONARY ANGIOGRAPHY;  Surgeon: Yvonne Kendall, MD;  Location: [facility] INVASIVE CV LAB;  Service: Cardiovascular;  Laterality: Left;    Current Medications: Current Meds  Medication Sig   acetaminophen (TYLENOL) 500 MG tablet Take 500-1,000 mg by mouth every 6 (six) hours as needed for moderate pain.   aspirin EC 81 MG tablet Take 1 tablet (81 mg total) by mouth daily. Swallow  whole.   atorvastatin (LIPITOR) 40 MG tablet Take 1 tablet (40 mg total) by mouth daily.   clopidogrel (PLAVIX) 75 MG tablet Take 1 tablet (75 mg total) by mouth daily with breakfast.   nitroGLYCERIN (NITROSTAT) 0.4 MG SL tablet Place 1 tablet (0.4 mg total) under the tongue every 5 (five) minutes as needed for chest pain. For a maximum of 3  doses    Allergies:   Patient has no known allergies.   Social History   Socioeconomic History   Marital status: Married    Spouse name: Not on file   Number of children: Not on file   Years of education: Not on file   Highest education level: Not on file  Occupational History   Not on file  Tobacco Use   Smoking status: Never   Smokeless tobacco: Never  Vaping Use   Vaping Use: Never used  Substance and Sexual Activity   Alcohol use: No   Drug use: No   Sexual activity: Not on file  Other Topics Concern   Not on file  Social History Narrative   Not on file   Social Determinants of Health   Financial Resource Strain: Not on file  Food Insecurity: Not on file  Transportation Needs: Not on file  Physical Activity: Not on file  Stress: Not on file  Social Connections: Not on file     Family History:  The patient's family history includes Hyperlipidemia in his father and mother. There is no history of Colon cancer or Prostate cancer.  ROS:   Review of Systems  Constitutional:  Negative for chills, diaphoresis, fever, malaise/fatigue and weight loss.  HENT:  Negative for congestion.   Eyes:  Negative for discharge and redness.  Respiratory:  Negative for cough, hemoptysis, sputum production, shortness of breath and wheezing.   Cardiovascular:  Negative for chest pain, palpitations, orthopnea, claudication, leg swelling and PND.  Gastrointestinal:  Negative for abdominal pain, blood in stool, heartburn, melena, nausea and vomiting.  Genitourinary:  Negative for hematuria.  Musculoskeletal:  Positive for falls. Negative for myalgias.  Skin:  Negative for rash.  Neurological:  Negative for dizziness, tingling, tremors, sensory change, speech change, focal weakness, loss of consciousness and weakness.  Endo/Heme/Allergies:  Does not bruise/bleed easily.  Psychiatric/Behavioral:  Negative for substance abuse. The patient is not nervous/anxious.   All other systems  reviewed and are negative.   EKGs/Labs/Other Studies Reviewed:    Studies reviewed were summarized above. The additional studies were reviewed today:  Coronary CTA with FFR 02/07/2021: 1. Left Main:  No significant stenosis. 2. LAD: significant stenosis in the proximal LAD.  FFRct 0.76 3. LCX: No significant stenosis. 4. RCA: No significant stenosis.   IMPRESSION: 1.  CT FFR analysis showed significant stenosis in the proximal LAD. 2.  Recommend cardiac catheterization.   FINDINGS: Aorta:  Normal size.  No calcifications.  No dissection.   Aortic Valve:  Trileaflet.  No calcifications.   Coronary Arteries:  Normal coronary origin.  Right dominance.   RCA is a dominant artery that gives rise to PDA and PLA. There is no plaque.   Left main is a large artery that gives rise to LAD and LCX arteries. LM is free of disease   LAD has calcified and noncalcified plaque in the proximal segment causing severe (>70%) stenosis.   LCX is a non-dominant artery that gives rise to two obtuse marginal branches. There is no plaque.   Other findings:   Normal pulmonary  vein drainage into the left atrium.   Normal left atrial appendage without a thrombus.   Normal size of the pulmonary artery.   IMPRESSION: 1. Coronary calcium score of 118. This was 89th percentile for age and sex matched control.   2. Normal coronary origin with right dominance.   3. Calcified and non calcified plaque causing severe stenosis(>70%) in the proximal LAD.   4. CAD-RADS 4 Severe stenosis. (70-99% or > 50% left main). Cardiac catheterization is recommended. Additional analysis with CT FFR will be submitted and reported separately.   5. Consider symptom-guided anti-ischemic pharmacotherapy as well as risk factor modification per guideline directed care. __________  [facility] 02/12/2021: Conclusions: Severe single-vessel coronary artery disease with 80-90% ostial/proximal LAD stenosis (minimal luminal  area 3.6 mm by IVUS) and 40-50% mid LAD stenosis (minimal luminal area 4.4 mm by IVUS).  No significant disease noted in the LCx and RCA. Normal left ventricular systolic function and filling pressure. Successful IVUS-guided PCI to the ostial/proximal LAD using Onyx frontier 3.0 x 15 mm drug-eluting stent (postdilated to 3.6 mm) with 0% residual stenosis and TIMI-3 flow.   Recommendations: Overnight extended recovery. Dual antiplatelet therapy with aspirin and clopidogrel for at least 6 months. Aggressive secondary prevention. __________  2D echo 02/13/2021: 1. Left ventricular ejection fraction, by estimation, is 55 to 60%. The  left ventricle has normal function. The left ventricle has no regional  wall motion abnormalities. Left ventricular diastolic parameters were  normal.   2. Right ventricular systolic function is normal. The right ventricular  size is not well visualized.   3. The mitral valve is normal in structure. No evidence of mitral valve  regurgitation.   4. The aortic valve is tricuspid. Aortic valve regurgitation is not  visualized.   5. The inferior vena cava is normal in size with greater than 50%  respiratory variability, suggesting right atrial pressure of 3 mmHg.    EKG:  EKG is ordered today.  The EKG ordered today demonstrates NSR, 69 bpm, rare atrial couplet, no acute ST-T changes  Recent Labs: 12/31/2020: ALT 18 02/13/2021: BUN 31; Creatinine, Ser 1.06; Hemoglobin 14.3; Platelets 251; Potassium 4.3; Sodium 139  Recent Lipid Panel    Component Value Date/Time   CHOL 182 12/31/2020 0833   CHOL 174 10/18/2014 1544   TRIG 69 12/31/2020 0833   HDL 55 12/31/2020 0833   HDL 59 10/18/2014 1544   CHOLHDL 3.3 12/31/2020 0833   VLDL 9 09/09/2016 0922   LDLCALC 111 (H) 12/31/2020 0833    PHYSICAL EXAM:    VS:  BP 120/80 (BP Location: Left Arm, Patient Position: Sitting, Cuff Size: Normal)   Pulse 69   Ht 5\' 9"  (1.753 m)   Wt 180 lb (81.6 kg)   SpO2 98%    BMI 26.58 kg/m   BMI: Body mass index is 26.58 kg/m.  Physical Exam Constitutional:      Appearance: He is well-developed.  HENT:     Head: Normocephalic and atraumatic.  Eyes:     General:        Right eye: No discharge.        Left eye: No discharge.  Neck:     Vascular: No JVD.  Cardiovascular:     Rate and Rhythm: Normal rate and regular rhythm.     Pulses:          Posterior tibial pulses are 2+ on the right side and 2+ on the left side.     Heart sounds:  Normal heart sounds, S1 normal and S2 normal. Heart sounds not distant. No midsystolic click and no opening snap. No murmur heard.   No friction rub.     Comments: Well-healing right radial artery arteriotomy site without active bleeding, swelling, warmth, erythema, or tenderness to palpation.  Resolving bruise noted along the superiomedial aspect of the right forearm.  Radial pulse 2+ proximal and distal to the arteriotomy site. Pulmonary:     Effort: Pulmonary effort is normal. No respiratory distress.     Breath sounds: Normal breath sounds. No decreased breath sounds, wheezing or rales.  Chest:     Chest wall: No tenderness.  Abdominal:     General: There is no distension.     Palpations: Abdomen is soft.     Tenderness: There is no abdominal tenderness.  Musculoskeletal:     Cervical back: Normal range of motion.     Right lower leg: No edema.     Left lower leg: No edema.  Skin:    General: Skin is warm and dry.     Nails: There is no clubbing.  Neurological:     Mental Status: He is alert and oriented to person, place, and time.  Psychiatric:        Speech: Speech normal.        Behavior: Behavior normal.        Thought Content: Thought content normal.        Judgment: Judgment normal.    Wt Readings from Last 3 Encounters:  02/25/21 180 lb (81.6 kg)  02/12/21 179 lb 8 oz (81.4 kg)  02/08/21 176 lb (79.8 kg)     ASSESSMENT & PLAN:   CAD involving the native coronary arteries without angina: He is  doing well without symptoms concerning for angina.  Continue risk factor modification and secondary prevention with aspirin, clopidogrel, atorvastatin, and as needed SL NTG.  Post-cath instructions.  He is cleared to resume full duty at work as of 02/27/2021.  He will be participating in cardiac rehab.  No indication for further ischemic testing at this time.  HLD: LDL 111 in 12/2020 with goal being less than 70.  Now on atorvastatin 40 mg daily, as of 02/08/2021.  Follow-up fasting lipid panel and LFT in early 04/2021 with recommendation to titrate lipid therapy as indicated to achieve target LDL.  Disposition: F/u with Dr. Azucena Cecil or an APP in 2 months.   Medication Adjustments/Labs and Tests Ordered: Current medicines are reviewed at length with the patient today.  Concerns regarding medicines are outlined above. Medication changes, Labs and Tests ordered today are summarized above and listed in the Patient Instructions accessible in Encounters.   Signed, Eula Listen, PA-C 02/25/2021 10:56 AM     [facility] [address] [place], [place] [address] [phone]

## 2021-02-25 ENCOUNTER — Encounter: Payer: Self-pay | Admitting: Physician Assistant

## 2021-02-25 ENCOUNTER — Ambulatory Visit: Payer: BC Managed Care – PPO | Admitting: Physician Assistant

## 2021-02-25 ENCOUNTER — Other Ambulatory Visit: Payer: Self-pay

## 2021-02-25 VITALS — BP 120/80 | HR 69 | Ht 69.0 in | Wt 180.0 lb

## 2021-02-25 DIAGNOSIS — E785 Hyperlipidemia, unspecified: Secondary | ICD-10-CM | POA: Diagnosis not present

## 2021-02-25 DIAGNOSIS — I2510 Atherosclerotic heart disease of native coronary artery without angina pectoris: Secondary | ICD-10-CM | POA: Diagnosis not present

## 2021-02-25 NOTE — Patient Instructions (Signed)
Medication Instructions:  Your physician recommends that you continue on your current medications as directed. Please refer to the Current Medication list given to you today.  *If you need a refill on your cardiac medications before your next appointment, please call your pharmacy*   Lab Work: None ordered If you have labs (blood work) drawn today and your tests are completely normal, you will receive your results only by: MyChart Message (if you have MyChart) OR A paper copy in the mail If you have any lab test that is abnormal or we need to change your treatment, we will call you to review the results.   Testing/Procedures: None ordered   Follow-Up: At [facility], you and your health needs are our priority.  As part of our continuing mission to provide you with exceptional heart care, we have created designated Provider Care Teams.  These Care Teams include your primary Cardiologist (physician) and Advanced Practice Providers (APPs -  Physician Assistants and Nurse Practitioners) who all work together to provide you with the care you need, when you need it.  We recommend signing up for the patient portal called "MyChart".  Sign up information is provided on this After Visit Summary.  MyChart is used to connect with patients for Virtual Visits (Telemedicine).  Patients are able to view lab/test results, encounter notes, upcoming appointments, etc.  Non-urgent messages can be sent to your provider as well.   To learn more about what you can do with MyChart, go to [email or web address].    Your next appointment:   2 month(s)  The format for your next appointment:   In Person  Provider:   Eula Listen, PA-C   Other Instructions N/A

## 2021-03-05 ENCOUNTER — Other Ambulatory Visit: Payer: Self-pay

## 2021-03-05 VITALS — Ht 68.5 in | Wt 179.7 lb

## 2021-03-05 DIAGNOSIS — Z955 Presence of coronary angioplasty implant and graft: Secondary | ICD-10-CM | POA: Diagnosis not present

## 2021-03-05 NOTE — Progress Notes (Signed)
Cardiac Individual Treatment Plan  Patient Details  Name: John Christensen MRN: [id number] Date of Birth: [birth year] Referring Provider:   Flowsheet Row Cardiac Rehab from 03/05/2021 in [facility] Cardiac and Pulmonary Rehab  Referring Provider End, Cristal Deer MD       Initial Encounter Date:  Flowsheet Row Cardiac Rehab from 03/05/2021 in [facility] Cardiac and Pulmonary Rehab  Date 03/05/21       Visit Diagnosis: Status post coronary artery stent placement  Patient's Home Medications on Admission:  Current Outpatient Medications:    acetaminophen (TYLENOL) 500 MG tablet, Take 500-1,000 mg by mouth every 6 (six) hours as needed for moderate pain., Disp: , Rfl:    albuterol (VENTOLIN HFA) 108 (90 Base) MCG/ACT inhaler, Inhale 2 puffs into the lungs every 6 (six) hours as needed for wheezing or shortness of breath. (Patient not taking: Reported on 02/25/2021), Disp: 8 g, Rfl: 2   aspirin EC 81 MG tablet, Take 1 tablet (81 mg total) by mouth daily. Swallow whole., Disp: 90 tablet, Rfl: 3   atorvastatin (LIPITOR) 40 MG tablet, Take 1 tablet (40 mg total) by mouth daily., Disp: 30 tablet, Rfl: 5   clopidogrel (PLAVIX) 75 MG tablet, Take 1 tablet (75 mg total) by mouth daily with breakfast., Disp: 90 tablet, Rfl: 3   nitroGLYCERIN (NITROSTAT) 0.4 MG SL tablet, Place 1 tablet (0.4 mg total) under the tongue every 5 (five) minutes as needed for chest pain. For a maximum of 3 doses, Disp: 90 tablet, Rfl: 3  Past Medical History: Past Medical History:  Diagnosis Date   Cardiac dysrhythmia, unspecified    Other malaise and fatigue    Unspecified congenital anomaly of heart     Tobacco Use: Social History   Tobacco Use  Smoking Status Never  Smokeless Tobacco Never    Labs: Recent Review Flowsheet Data     Labs for ITP Cardiac and Pulmonary Rehab Latest Ref Rng & Units 10/18/2014 09/09/2016 09/25/2017 12/28/2019 12/31/2020   Cholestrol <200 mg/dL [phone]   LDLCALC mg/dL (calc)  858(I) 85 98 502(D) 111(H)   HDL > OR = 40 mg/dL 59 63 54 59 55   Trlycerides <150 mg/dL 51 47 51 65 69   Hemoglobin A1c <5.7 % of total Hgb - - 4.7 4.5 4.7        Exercise Target Goals: Exercise Program Goal: Individual exercise prescription set using results from initial 6 min walk test and THRR while considering  patient's activity barriers and safety.   Exercise Prescription Goal: Initial exercise prescription builds to 30-45 minutes a day of aerobic activity, 2-3 days per week.  Home exercise guidelines will be given to patient during program as part of exercise prescription that the participant will acknowledge.   Education: Aerobic Exercise: - Group verbal and visual presentation on the components of exercise prescription. Introduces F.I.T.T principle from ACSM for exercise prescriptions.  Reviews F.I.T.T. principles of aerobic exercise including progression. Written material given at graduation. Flowsheet Row Cardiac Rehab from 03/05/2021 in [facility] Cardiac and Pulmonary Rehab  Education need identified 03/05/21       Education: Resistance Exercise: - Group verbal and visual presentation on the components of exercise prescription. Introduces F.I.T.T principle from ACSM for exercise prescriptions  Reviews F.I.T.T. principles of resistance exercise including progression. Written material given at graduation.    Education: Exercise & Equipment Safety: - Individual verbal instruction and demonstration of equipment use and safety with use of the equipment. Flowsheet Row Cardiac Rehab from  03/05/2021 in [facility] Cardiac and Pulmonary Rehab  Education need identified 03/05/21  Date 03/05/21  Educator KL  Instruction Review Code 1- Verbalizes Understanding       Education: Exercise Physiology & General Exercise Guidelines: - Group verbal and written instruction with models to review the exercise physiology of the cardiovascular system and associated critical values. Provides general  exercise guidelines with specific guidelines to those with heart or lung disease.  Flowsheet Row Cardiac Rehab from 03/05/2021 in [facility] Cardiac and Pulmonary Rehab  Education need identified 03/05/21       Education: Flexibility, Balance, Mind/Body Relaxation: - Group verbal and visual presentation with interactive activity on the components of exercise prescription. Introduces F.I.T.T principle from ACSM for exercise prescriptions. Reviews F.I.T.T. principles of flexibility and balance exercise training including progression. Also discusses the mind body connection.  Reviews various relaxation techniques to help reduce and manage stress (i.e. Deep breathing, progressive muscle relaxation, and visualization). Balance handout provided to take home. Written material given at graduation.   Activity Barriers & Risk Stratification:  Activity Barriers & Cardiac Risk Stratification - 03/05/21 0920       Activity Barriers & Cardiac Risk Stratification   Activity Barriers None    Cardiac Risk Stratification Moderate             6 Minute Walk:  6 Minute Walk     Row Name 03/05/21 0920         6 Minute Walk   Phase Initial     Distance 1265 feet     Walk Time 6 minutes     # of Rest Breaks 0     MPH 2.39     METS 3.77     RPE 7     Perceived Dyspnea  0     VO2 Peak 13.2     Symptoms No     Resting HR 72 bpm     Resting BP 112/58     Resting Oxygen Saturation  98 %     Exercise Oxygen Saturation  during 6 min walk 100 %     Max Ex. HR 88 bpm     Max Ex. BP 124/72     2 Minute Post BP 108/76              Oxygen Initial Assessment:   Oxygen Re-Evaluation:   Oxygen Discharge (Final Oxygen Re-Evaluation):   Initial Exercise Prescription:  Initial Exercise Prescription - 03/05/21 0900       Date of Initial Exercise RX and Referring Provider   Date 03/05/21    Referring Provider End, Cristal Deer MD      Treadmill   MPH 2.5    Grade 1.5    Minutes 15    METs  3.43      NuStep   Level 3    SPM 80    Minutes 15    METs 3.7      REL-XR   Level 3    Speed 50    Minutes 15    METs 3.7      Prescription Details   Frequency (times per week) 2    Duration Progress to 30 minutes of continuous aerobic without signs/symptoms of physical distress      Intensity   THRR 40-80% of Max Heartrate 110-149    Ratings of Perceived Exertion 11-13    Perceived Dyspnea 0-4      Progression   Progression Continue to progress workloads to maintain intensity without  signs/symptoms of physical distress.      Resistance Training   Training Prescription Yes    Weight 4 lb    Reps 10-15             Perform Capillary Blood Glucose checks as needed.  Exercise Prescription Changes:   Exercise Prescription Changes     Row Name 03/05/21 0900             Response to Exercise   Blood Pressure (Admit) 112/78       Blood Pressure (Exercise) 124/72       Blood Pressure (Exit) 108/76       Heart Rate (Admit) 72 bpm       Heart Rate (Exercise) 88 bpm       Heart Rate (Exit) 68 bpm       Oxygen Saturation (Admit) 98 %       Oxygen Saturation (Exercise) 100 %       Oxygen Saturation (Exit) 98 %       Rating of Perceived Exertion (Exercise) 7       Perceived Dyspnea (Exercise) 0       Symptoms none       Comments walk test results                Exercise Comments:   Exercise Goals and Review:   Exercise Goals     Row Name 03/05/21 0937             Exercise Goals   Increase Physical Activity Yes       Intervention Provide advice, education, support and counseling about physical activity/exercise needs.;Develop an individualized exercise prescription for aerobic and resistive training based on initial evaluation findings, risk stratification, comorbidities and participant's personal goals.       Expected Outcomes Short Term: Attend rehab on a regular basis to increase amount of physical activity.;Long Term: Add in home exercise to  make exercise part of routine and to increase amount of physical activity.;Long Term: Exercising regularly at least 3-5 days a week.       Increase Strength and Stamina Yes       Intervention Provide advice, education, support and counseling about physical activity/exercise needs.;Develop an individualized exercise prescription for aerobic and resistive training based on initial evaluation findings, risk stratification, comorbidities and participant's personal goals.       Expected Outcomes Short Term: Increase workloads from initial exercise prescription for resistance, speed, and METs.;Short Term: Perform resistance training exercises routinely during rehab and add in resistance training at home;Long Term: Improve cardiorespiratory fitness, muscular endurance and strength as measured by increased METs and functional capacity ( )       Able to understand and use rate of perceived exertion (RPE) scale Yes       Intervention Provide education and explanation on how to use RPE scale       Expected Outcomes Short Term: Able to use RPE daily in rehab to express subjective intensity level;Long Term:  Able to use RPE to guide intensity level when exercising independently       Able to understand and use Dyspnea scale Yes       Intervention Provide education and explanation on how to use Dyspnea scale       Expected Outcomes Short Term: Able to use Dyspnea scale daily in rehab to express subjective sense of shortness of breath during exertion;Long Term: Able to use Dyspnea scale to guide intensity level when exercising independently  Knowledge and understanding of Target Heart Rate Range (THRR) Yes       Intervention Provide education and explanation of THRR including how the numbers were predicted and where they are located for reference       Expected Outcomes Short Term: Able to state/look up THRR;Long Term: Able to use THRR to govern intensity when exercising independently;Short Term: Able to use  daily as guideline for intensity in rehab       Able to check pulse independently Yes       Intervention Provide education and demonstration on how to check pulse in carotid and radial arteries.;Review the importance of being able to check your own pulse for safety during independent exercise       Expected Outcomes Short Term: Able to explain why pulse checking is important during independent exercise;Long Term: Able to check pulse independently and accurately       Understanding of Exercise Prescription Yes       Intervention Provide education, explanation, and written materials on patient's individual exercise prescription       Expected Outcomes Short Term: Able to explain program exercise prescription;Long Term: Able to explain home exercise prescription to exercise independently                Exercise Goals Re-Evaluation :   Discharge Exercise Prescription (Final Exercise Prescription Changes):  Exercise Prescription Changes - 03/05/21 0900       Response to Exercise   Blood Pressure (Admit) 112/78    Blood Pressure (Exercise) 124/72    Blood Pressure (Exit) 108/76    Heart Rate (Admit) 72 bpm    Heart Rate (Exercise) 88 bpm    Heart Rate (Exit) 68 bpm    Oxygen Saturation (Admit) 98 %    Oxygen Saturation (Exercise) 100 %    Oxygen Saturation (Exit) 98 %    Rating of Perceived Exertion (Exercise) 7    Perceived Dyspnea (Exercise) 0    Symptoms none    Comments walk test results             Nutrition:  Target Goals: Understanding of nutrition guidelines, daily intake of sodium 1500mg , cholesterol 200mg , calories 30% from fat and 7% or less from saturated fats, daily to have 5 or more servings of fruits and vegetables.  Education: All About Nutrition: -Group instruction provided by verbal, written material, interactive activities, discussions, models, and posters to present general guidelines for heart healthy nutrition including fat, fiber, MyPlate, the role of  sodium in heart healthy nutrition, utilization of the nutrition label, and utilization of this knowledge for meal planning. Follow up email sent as well. Written material given at graduation.   Biometrics:  Pre Biometrics - 03/05/21 0919       Pre Biometrics   Height 5' 8.5" (1.74 m)    Weight 179 lb 11.2 oz (81.5 kg)    BMI (Calculated) 26.92    Single Leg Stand 30 seconds              Nutrition Therapy Plan and Nutrition Goals:   Nutrition Assessments:  MEDIFICTS Score Key: ?70 Need to make dietary changes  40-70 Heart Healthy Diet ? 40 Therapeutic Level Cholesterol Diet  Flowsheet Row Cardiac Rehab from 03/05/2021 in [facility] Cardiac and Pulmonary Rehab  Picture Your Plate Total Score on Admission 57      Picture Your Plate Scores: <15 Unhealthy dietary pattern with much room for improvement. 41-50 Dietary pattern unlikely to meet recommendations for good health  and room for improvement. 51-60 More healthful dietary pattern, with some room for improvement.  >60 Healthy dietary pattern, although there may be some specific behaviors that could be improved.    Nutrition Goals Re-Evaluation:   Nutrition Goals Discharge (Final Nutrition Goals Re-Evaluation):   Psychosocial: Target Goals: Acknowledge presence or absence of significant depression and/or stress, maximize coping skills, provide positive support system. Participant is able to verbalize types and ability to use techniques and skills needed for reducing stress and depression.   Education: Stress, Anxiety, and Depression - Group verbal and visual presentation to define topics covered.  Reviews how body is impacted by stress, anxiety, and depression.  Also discusses healthy ways to reduce stress and to treat/manage anxiety and depression.  Written material given at graduation. Flowsheet Row Cardiac Rehab from 03/05/2021 in [facility] Cardiac and Pulmonary Rehab  Education need identified 03/05/21        Education: Sleep Hygiene -Provides group verbal and written instruction about how sleep can affect your health.  Define sleep hygiene, discuss sleep cycles and impact of sleep habits. Review good sleep hygiene tips.    Initial Review & Psychosocial Screening:  Initial Psych Review & Screening - 02/22/21 1511       Initial Review   Current issues with None Identified      Family Dynamics   Good Support System? Yes   family     Barriers   Psychosocial barriers to participate in program There are no identifiable barriers or psychosocial needs.;The patient should benefit from training in stress management and relaxation.      Screening Interventions   Interventions Encouraged to exercise;Provide feedback about the scores to participant;To provide support and resources with identified psychosocial needs    Expected Outcomes Short Term goal: Utilizing psychosocial counselor, staff and physician to assist with identification of specific Stressors or current issues interfering with healing process. Setting desired goal for each stressor or current issue identified.;Long Term Goal: Stressors or current issues are controlled or eliminated.;Short Term goal: Identification and review with participant of any Quality of Life or Depression concerns found by scoring the questionnaire.;Long Term goal: The participant improves quality of Life and PHQ9 Scores as seen by post scores and/or verbalization of changes             Quality of Life Scores:   Quality of Life - 03/05/21 0918       Quality of Life   Select Quality of Life      Quality of Life Scores   Health/Function Pre 28.03 %    Socioeconomic Pre 26.88 %    Psych/Spiritual Pre 29.29 %    Family Pre 29.5 %    GLOBAL Pre 28.23 %            Scores of 19 and below usually indicate a poorer quality of life in these areas.  A difference of  2-3 points is a clinically meaningful difference.  A difference of 2-3 points in the total  score of the Quality of Life Index has been associated with significant improvement in overall quality of life, self-image, physical symptoms, and general health in studies assessing change in quality of life.  PHQ-9: Recent Review Flowsheet Data     Depression screen [facility] 2/9 03/05/2021 12/31/2020 12/28/2019 09/11/2016 10/18/2014   Decreased Interest 0 0 0 0 0   Down, Depressed, Hopeless 0 0 0 0 0   PHQ - 2 Score 0 0 0 0 0   Altered sleeping 0  0 - 1 -   Tired, decreased energy 0 0 - 1 -   Change in appetite 0 0 - 0 -   Feeling bad or failure about yourself  0 0 - 0 -   Trouble concentrating 0 0 - 0 -   Moving slowly or fidgety/restless 0 0 - 0 -   Suicidal thoughts 0 0 - 0 -   PHQ-9 Score 0 0 - 2 -   Difficult doing work/chores Not difficult at all Not difficult at all - Not difficult at all -      Interpretation of Total Score  Total Score Depression Severity:  1-4 = Minimal depression, 5-9 = Mild depression, 10-14 = Moderate depression, 15-19 = Moderately severe depression, 20-27 = Severe depression   Psychosocial Evaluation and Intervention:  Psychosocial Evaluation - 02/22/21 1520       Psychosocial Evaluation & Interventions   Interventions Encouraged to exercise with the program and follow exercise prescription    Comments Zyshawn reports feeling well after his stent. 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 His blockage came as a surprise as all his past physicals have been without significant findings. He started noticing some shortness of breath during work and then some chest discomfort so he reached out to his MD. After the stent he feels like he did prior to the start of his issues. He does not report any stress concerns at this time and states he sleeps great. He wants to maintain a heart healthy lifestyle and it motivated to work hard    Expected Outcomes Short: attend cardiac rehab for education and exercise. Long:develop and maintain positive self care  habits.    Continue Psychosocial Services  Follow up required by staff             Psychosocial Re-Evaluation:   Psychosocial Discharge (Final Psychosocial Re-Evaluation):   Vocational Rehabilitation: Provide vocational rehab assistance to qualifying candidates.   Vocational Rehab Evaluation & Intervention:  Vocational Rehab - 02/22/21 1511       Initial Vocational Rehab Evaluation & Intervention   Assessment shows need for Vocational Rehabilitation No             Education: Education Goals: Education classes will be provided on a variety of topics geared toward better understanding of heart health and risk factor modification. Participant will state understanding/return demonstration of topics presented as noted by education test scores.  Learning Barriers/Preferences:  Learning Barriers/Preferences - 02/22/21 1511       Learning Barriers/Preferences   Learning Barriers None    Learning Preferences None             General Cardiac Education Topics:  AED/CPR: - Group verbal and written instruction with the use of models to demonstrate the basic use of the AED with the basic ABC's of resuscitation.   Anatomy and Cardiac Procedures: - Group verbal and visual presentation and models provide information about basic cardiac anatomy and function. Reviews the testing methods done to diagnose heart disease and the outcomes of the test results. Describes the treatment choices: Medical Management, Angioplasty, or Coronary Bypass Surgery for treating various heart conditions including Myocardial Infarction, Angina, Valve Disease, and Cardiac Arrhythmias.  Written material given at graduation.   Medication Safety: - Group verbal and visual instruction to review commonly prescribed medications for heart and lung disease. Reviews the medication, class of the drug, and side effects. Includes the steps to properly store meds and maintain the  prescription regimen.  Written  material given at graduation.   Intimacy: - Group verbal instruction through game format to discuss how heart and lung disease can affect sexual intimacy. Written material given at graduation..   Know Your Numbers and Heart Failure: - Group verbal and visual instruction to discuss disease risk factors for cardiac and pulmonary disease and treatment options.  Reviews associated critical values for Overweight/Obesity, Hypertension, Cholesterol, and Diabetes.  Discusses basics of heart failure: signs/symptoms and treatments.  Introduces Heart Failure Zone chart for action plan for heart failure.  Written material given at graduation.   Infection Prevention: - Provides verbal and written material to individual with discussion of infection control including proper hand washing and proper equipment cleaning during exercise session. Flowsheet Row Cardiac Rehab from 03/05/2021 in [facility] Cardiac and Pulmonary Rehab  Education need identified 03/05/21  Date 03/05/21  Educator KL  Instruction Review Code 1- Verbalizes Understanding       Falls Prevention: - Provides verbal and written material to individual with discussion of falls prevention and safety. Flowsheet Row Cardiac Rehab from 03/05/2021 in [facility] Cardiac and Pulmonary Rehab  Education need identified 03/05/21  Date 03/05/21  Educator KL  Instruction Review Code 1- Verbalizes Understanding       Other: -Provides group and verbal instruction on various topics (see comments)   Knowledge Questionnaire Score:  Knowledge Questionnaire Score - 03/05/21 1610       Knowledge Questionnaire Score   Pre Score 22/26: Exercise, Smoking, Depression             Core Components/Risk Factors/Patient Goals at Admission:  Personal Goals and Risk Factors at Admission - 03/05/21 0937       Core Components/Risk Factors/Patient Goals on Admission    Weight Management Yes;Weight Loss    Intervention Weight Management: Develop a combined  nutrition and exercise program designed to reach desired caloric intake, while maintaining appropriate intake of nutrient and fiber, sodium and fats, and appropriate energy expenditure required for the weight goal.;Weight Management: Provide education and appropriate resources to help participant work on and attain dietary goals.;Weight Management/Obesity: Establish reasonable short term and long term weight goals.    Admit Weight 179 lb (81.2 kg)    Goal Weight: Short Term 175 lb (79.4 kg)    Goal Weight: Long Term 170 lb (77.1 kg)    Expected Outcomes Short Term: Continue to assess and modify interventions until short term weight is achieved;Long Term: Adherence to nutrition and physical activity/exercise program aimed toward attainment of established weight goal;Weight Loss: Understanding of general recommendations for a balanced deficit meal plan, which promotes 1-2 lb weight loss per week and includes a negative energy balance of [phone] kcal/d;Understanding recommendations for meals to include 15-35% energy as protein, 25-35% energy from fat, 35-60% energy from carbohydrates, less than  of dietary cholesterol, 20-35 gm of total fiber daily;Understanding of distribution of calorie intake throughout the day with the consumption of 4-5 meals/snacks    Lipids Yes    Intervention Provide education and support for participant on nutrition & aerobic/resistive exercise along with prescribed medications to achieve LDL 70mg , HDL >40mg .    Expected Outcomes Short Term: Participant states understanding of desired cholesterol values and is compliant with medications prescribed. Participant is following exercise prescription and nutrition guidelines.;Long Term: Cholesterol controlled with medications as prescribed, with individualized exercise RX and with personalized nutrition plan. Value goals: LDL < , HDL > 40 mg.  Education:Diabetes - Individual verbal and written instruction to  review signs/symptoms of diabetes, desired ranges of glucose level fasting, after meals and with exercise. Acknowledge that pre and post exercise glucose checks will be done for 3 sessions at entry of program.   Core Components/Risk Factors/Patient Goals Review:    Core Components/Risk Factors/Patient Goals at Discharge (Final Review):    ITP Comments:  ITP Comments     Row Name 02/22/21 1517 03/05/21 0917         ITP Comments Initial telephone orientation completed. Diagnosis can be found in [facility] 9/27. EP orientation scheduled for Tuesday 10/18 at 8am. Completed and gym orientation. Initial ITP created and sent for review to Dr. Bethann Punches, Medical Director.               Comments: Initial ITP

## 2021-03-05 NOTE — Patient Instructions (Signed)
Patient Instructions  Patient Details  Name: John Christensen MRN: [id number] Date of Birth: May 30, 1970 Referring Provider:  Yvonne Kendall, MD  Below are your personal goals for exercise, nutrition, and risk factors. Our goal is to help you stay on track towards obtaining and maintaining these goals. We will be discussing your progress on these goals with you throughout the program.  Initial Exercise Prescription:  Initial Exercise Prescription - 03/05/21 0900       Date of Initial Exercise RX and Referring Provider   Date 03/05/21    Referring Provider End, Cristal Deer MD      Treadmill   MPH 2.5    Grade 1.5    Minutes 15    METs 3.43      NuStep   Level 3    SPM 80    Minutes 15    METs 3.7      REL-XR   Level 3    Speed 50    Minutes 15    METs 3.7      Prescription Details   Frequency (times per week) 2    Duration Progress to 30 minutes of continuous aerobic without signs/symptoms of physical distress      Intensity   THRR 40-80% of Max Heartrate 110-149    Ratings of Perceived Exertion 11-13    Perceived Dyspnea 0-4      Progression   Progression Continue to progress workloads to maintain intensity without signs/symptoms of physical distress.      Resistance Training   Training Prescription Yes    Weight 4 lb    Reps 10-15             Exercise Goals: Frequency: Be able to perform aerobic exercise two to three times per week in program working toward 2-5 days per week of home exercise.  Intensity: Work with a perceived exertion of 11 (fairly light) - 15 (hard) while following your exercise prescription.  We will make changes to your prescription with you as you progress through the program.   Duration: Be able to do 30 to 45 minutes of continuous aerobic exercise in addition to a 5 minute warm-up and a 5 minute cool-down routine.   Nutrition Goals: Your personal nutrition goals will be established when you do your nutrition analysis with the  dietician.  The following are general nutrition guidelines to follow: Cholesterol < 200mg /day Sodium < 1500mg /day Fiber: Men over 50 yrs - 30 grams per day  Personal Goals:  Personal Goals and Risk Factors at Admission - 03/05/21 0937       Core Components/Risk Factors/Patient Goals on Admission    Weight Management Yes;Weight Loss    Intervention Weight Management: Develop a combined nutrition and exercise program designed to reach desired caloric intake, while maintaining appropriate intake of nutrient and fiber, sodium and fats, and appropriate energy expenditure required for the weight goal.;Weight Management: Provide education and appropriate resources to help participant work on and attain dietary goals.;Weight Management/Obesity: Establish reasonable short term and long term weight goals.    Admit Weight 179 lb (81.2 kg)    Goal Weight: Short Term 175 lb (79.4 kg)    Goal Weight: Long Term 170 lb (77.1 kg)    Expected Outcomes Short Term: Continue to assess and modify interventions until short term weight is achieved;Long Term: Adherence to nutrition and physical activity/exercise program aimed toward attainment of established weight goal;Weight Loss: Understanding of general recommendations for a balanced deficit meal plan, which promotes 1-2  lb weight loss per week and includes a negative energy balance of [phone] kcal/d;Understanding recommendations for meals to include 15-35% energy as protein, 25-35% energy from fat, 35-60% energy from carbohydrates, less than 200mg  of dietary cholesterol, 20-35 gm of total fiber daily;Understanding of distribution of calorie intake throughout the day with the consumption of 4-5 meals/snacks    Lipids Yes    Intervention Provide education and support for participant on nutrition & aerobic/resistive exercise along with prescribed medications to achieve LDL 70mg , HDL >40mg .    Expected Outcomes Short Term: Participant states understanding of desired  cholesterol values and is compliant with medications prescribed. Participant is following exercise prescription and nutrition guidelines.;Long Term: Cholesterol controlled with medications as prescribed, with individualized exercise RX and with personalized nutrition plan. Value goals: LDL < 70mg , HDL > 40 mg.             Tobacco Use Initial Evaluation: Social History   Tobacco Use  Smoking Status Never  Smokeless Tobacco Never    Exercise Goals and Review:  Exercise Goals     Row Name 03/05/21 0937             Exercise Goals   Increase Physical Activity Yes       Intervention Provide advice, education, support and counseling about physical activity/exercise needs.;Develop an individualized exercise prescription for aerobic and resistive training based on initial evaluation findings, risk stratification, comorbidities and participant's personal goals.       Expected Outcomes Short Term: Attend rehab on a regular basis to increase amount of physical activity.;Long Term: Add in home exercise to make exercise part of routine and to increase amount of physical activity.;Long Term: Exercising regularly at least 3-5 days a week.       Increase Strength and Stamina Yes       Intervention Provide advice, education, support and counseling about physical activity/exercise needs.;Develop an individualized exercise prescription for aerobic and resistive training based on initial evaluation findings, risk stratification, comorbidities and participant's personal goals.       Expected Outcomes Short Term: Increase workloads from initial exercise prescription for resistance, speed, and METs.;Short Term: Perform resistance training exercises routinely during rehab and add in resistance training at home;Long Term: Improve cardiorespiratory fitness, muscular endurance and strength as measured by increased METs and functional capacity ( )       Able to understand and use rate of perceived exertion  (RPE) scale Yes       Intervention Provide education and explanation on how to use RPE scale       Expected Outcomes Short Term: Able to use RPE daily in rehab to express subjective intensity level;Long Term:  Able to use RPE to guide intensity level when exercising independently       Able to understand and use Dyspnea scale Yes       Intervention Provide education and explanation on how to use Dyspnea scale       Expected Outcomes Short Term: Able to use Dyspnea scale daily in rehab to express subjective sense of shortness of breath during exertion;Long Term: Able to use Dyspnea scale to guide intensity level when exercising independently       Knowledge and understanding of Target Heart Rate Range (THRR) Yes       Intervention Provide education and explanation of THRR including how the numbers were predicted and where they are located for reference       Expected Outcomes Short Term: Able to state/look up THRR;Long Term: Able  to use THRR to govern intensity when exercising independently;Short Term: Able to use daily as guideline for intensity in rehab       Able to check pulse independently Yes       Intervention Provide education and demonstration on how to check pulse in carotid and radial arteries.;Review the importance of being able to check your own pulse for safety during independent exercise       Expected Outcomes Short Term: Able to explain why pulse checking is important during independent exercise;Long Term: Able to check pulse independently and accurately       Understanding of Exercise Prescription Yes       Intervention Provide education, explanation, and written materials on patient's individual exercise prescription       Expected Outcomes Short Term: Able to explain program exercise prescription;Long Term: Able to explain home exercise prescription to exercise independently                Copy of goals given to participant.

## 2021-03-06 ENCOUNTER — Other Ambulatory Visit: Payer: BC Managed Care – PPO

## 2021-03-06 ENCOUNTER — Encounter: Payer: BC Managed Care – PPO | Admitting: *Deleted

## 2021-03-06 ENCOUNTER — Other Ambulatory Visit: Payer: Self-pay

## 2021-03-06 DIAGNOSIS — Z955 Presence of coronary angioplasty implant and graft: Secondary | ICD-10-CM

## 2021-03-06 NOTE — Progress Notes (Signed)
Daily Session Note  Patient Details  Name: John Christensen MRN: [id number] Date of Birth: 1970-09-16 Referring Provider:   Flowsheet Row Cardiac Rehab from 03/05/2021 in [facility] Cardiac and Pulmonary Rehab  Referring Provider End, Harrell Gave MD       Encounter Date: 03/06/2021  Check In:  Session Check In - 03/06/21 1708       Check-In   Supervising physician immediately available to respond to emergencies See telemetry face sheet for immediately available ER MD    Location [facility]-Cardiac & Pulmonary Rehab    Staff Present Renita Papa, RN BSN;Joseph Tessie Fass, RCP,RRT,BSRT;Melissa Tryon, [place], LDN    Virtual Visit No    Medication changes reported     No    Fall or balance concerns reported    No    Warm-up and Cool-down Performed on first and last piece of equipment    Resistance Training Performed Yes    VAD Patient? No    PAD/SET Patient? No      Pain Assessment   Currently in Pain? No/denies                Social History   Tobacco Use  Smoking Status Never  Smokeless Tobacco Never    Goals Met:  Independence with exercise equipment Exercise tolerated well No report of concerns or symptoms today Strength training completed today  Goals Unmet:  Not Applicable  Comments: First full day of exercise!  Patient was oriented to gym and equipment including functions, settings, policies, and procedures.  Patient's individual exercise prescription and treatment plan were reviewed.  All starting workloads were established based on the results of the 6 minute walk test done at initial orientation visit.  The plan for exercise progression was also introduced and progression will be customized based on patient's performance and goals.     Dr. Emily Filbert is Medical Director for [facility].  Dr. Ottie Glazier is Medical Director for [facility] Pulmonary Rehabilitation.

## 2021-03-11 ENCOUNTER — Encounter: Payer: BC Managed Care – PPO | Admitting: *Deleted

## 2021-03-11 ENCOUNTER — Other Ambulatory Visit: Payer: Self-pay

## 2021-03-11 DIAGNOSIS — Z955 Presence of coronary angioplasty implant and graft: Secondary | ICD-10-CM | POA: Diagnosis not present

## 2021-03-11 NOTE — Progress Notes (Signed)
Daily Session Note  Patient Details  Name: John Christensen MRN: [id number] Date of Birth: 03/28/1971 Referring Provider:   Flowsheet Row Cardiac Rehab from 03/05/2021 in [facility] Cardiac and Pulmonary Rehab  Referring Provider End, Harrell Gave MD       Encounter Date: 03/11/2021  Check In:  Session Check In - 03/11/21 1710       Check-In   Supervising physician immediately available to respond to emergencies See telemetry face sheet for immediately available ER MD    Location [facility]-Cardiac & Pulmonary Rehab    Staff Present Renita Papa, RN BSN;Joseph Tessie Fass, RCP,RRT,BSRT;Melissa [place], [place], LDN    Virtual Visit No    Medication changes reported     No    Fall or balance concerns reported    No    Warm-up and Cool-down Performed on first and last piece of equipment    Resistance Training Performed Yes    VAD Patient? No    PAD/SET Patient? No      Pain Assessment   Currently in Pain? No/denies                Social History   Tobacco Use  Smoking Status Never  Smokeless Tobacco Never    Goals Met:  Independence with exercise equipment Exercise tolerated well No report of concerns or symptoms today Strength training completed today  Goals Unmet:  Not Applicable  Comments: Pt able to follow exercise prescription today without complaint.  Will continue to monitor for progression.    Dr. Emily Filbert is Medical Director for [place].  Dr. Ottie Glazier is Medical Director for [facility] Pulmonary Rehabilitation.

## 2021-03-13 ENCOUNTER — Other Ambulatory Visit: Payer: Self-pay

## 2021-03-13 DIAGNOSIS — Z955 Presence of coronary angioplasty implant and graft: Secondary | ICD-10-CM

## 2021-03-13 NOTE — Progress Notes (Signed)
Daily Session Note  Patient Details  Name: John Christensen MRN: [id number] Date of Birth: 22-Jan-1971 Referring Provider:   Flowsheet Row Cardiac Rehab from 03/05/2021 in [facility] Cardiac and Pulmonary Rehab  Referring Provider End, Harrell Gave MD       Encounter Date: 03/13/2021  Check In:  Session Check In - 03/13/21 1628       Check-In   Supervising physician immediately available to respond to emergencies See telemetry face sheet for immediately available ER MD    Location [facility]-Cardiac & Pulmonary Rehab    Staff Present Birdie Sons, MPA, Nino Glow, MS, ASCM CEP, Exercise Physiologist;Joseph Tessie Fass, [place]    Virtual Visit No    Medication changes reported     No    Fall or balance concerns reported    No    Warm-up and Cool-down Performed on first and last piece of equipment    Resistance Training Performed Yes    VAD Patient? No    PAD/SET Patient? No      Pain Assessment   Currently in Pain? No/denies                Social History   Tobacco Use  Smoking Status Never  Smokeless Tobacco Never    Goals Met:  Independence with exercise equipment Exercise tolerated well No report of concerns or symptoms today Strength training completed today  Goals Unmet:  Not Applicable  Comments: Pt able to follow exercise prescription today without complaint.  Will continue to monitor for progression.    Dr. Emily Filbert is Medical Director for [facility].  Dr. Ottie Glazier is Medical Director for [facility] Pulmonary Rehabilitation.

## 2021-03-14 ENCOUNTER — Ambulatory Visit: Payer: BC Managed Care – PPO | Admitting: Cardiology

## 2021-03-18 ENCOUNTER — Other Ambulatory Visit: Payer: Self-pay

## 2021-03-18 DIAGNOSIS — Z955 Presence of coronary angioplasty implant and graft: Secondary | ICD-10-CM | POA: Diagnosis not present

## 2021-03-18 NOTE — Progress Notes (Signed)
Daily Session Note  Patient Details  Name: John Christensen MRN: [id number] Date of Birth: 04-Apr-1971 Referring Provider:   Flowsheet Row Cardiac Rehab from 03/05/2021 in [facility] Cardiac [facility]monary Rehab  Referring Provider End, Harrell Gave MD       Encounter Date: 03/18/2021  Check In:  Session Check In - 03/18/21 1713       Check-In   Supervising physician immediately available to respond to emergencies See telemetry face sheet for immediately available ER MD    Location [facility]-Cardiac & Pulmonary Rehab    Staff Present Birdie Sons, MPA, Nino Glow, MS, ASCM CEP, Exercise Physiologist    Virtual Visit No    Medication changes reported     No    Fall or balance concerns reported    No    Warm-up and Cool-down Performed on first and last piece of equipment    Resistance Training Performed Yes    VAD Patient? No    PAD/SET Patient? No      Pain Assessment   Currently in Pain? No/denies                Social History   Tobacco Use  Smoking Status Never  Smokeless Tobacco Never    Goals Met:  Independence with exercise equipment Exercise tolerated well No report of concerns or symptoms today Strength training completed today  Goals Unmet:  Not Applicable  Comments: .exgoo   Dr. Emily Filbert is Medical Director for [place].  Dr. Ottie Glazier is Medical Director for [facility] Pulmonary Rehabilitation.

## 2021-03-20 ENCOUNTER — Encounter: Payer: BC Managed Care – PPO | Attending: Cardiology | Admitting: *Deleted

## 2021-03-20 ENCOUNTER — Encounter: Payer: Self-pay | Admitting: *Deleted

## 2021-03-20 ENCOUNTER — Other Ambulatory Visit: Payer: Self-pay

## 2021-03-20 DIAGNOSIS — Z955 Presence of coronary angioplasty implant and graft: Secondary | ICD-10-CM | POA: Insufficient documentation

## 2021-03-20 NOTE — Progress Notes (Signed)
Cardiac Individual Treatment Plan  Patient Details  Name: John Christensen MRN: [id number] Date of Birth: Sep 24, 1970 Referring Provider:   Flowsheet Row Cardiac Rehab from 03/05/2021 in [facility] Cardiac and Pulmonary Rehab  Referring Provider End, Cristal Deer MD       Initial Encounter Date:  Flowsheet Row Cardiac Rehab from 03/05/2021 in [facility] Cardiac and Pulmonary Rehab  Date 03/05/21       Visit Diagnosis: Status post coronary artery stent placement  Patient's Home Medications on Admission:  Current Outpatient Medications:    acetaminophen (TYLENOL) 500 MG tablet, Take 500-1,000 mg by mouth every 6 (six) hours as needed for moderate pain., Disp: , Rfl:    albuterol (VENTOLIN HFA) 108 (90 Base) MCG/ACT inhaler, Inhale 2 puffs into the lungs every 6 (six) hours as needed for wheezing or shortness of breath. (Patient not taking: Reported on 02/25/2021), Disp: 8 g, Rfl: 2   aspirin EC 81 MG tablet, Take 1 tablet (81 mg total) by mouth daily. Swallow whole., Disp: 90 tablet, Rfl: 3   atorvastatin (LIPITOR) 40 MG tablet, Take 1 tablet (40 mg total) by mouth daily., Disp: 30 tablet, Rfl: 5   clopidogrel (PLAVIX) 75 MG tablet, Take 1 tablet (75 mg total) by mouth daily with breakfast., Disp: 90 tablet, Rfl: 3   nitroGLYCERIN (NITROSTAT) 0.4 MG SL tablet, Place 1 tablet (0.4 mg total) under the tongue every 5 (five) minutes as needed for chest pain. For a maximum of 3 doses, Disp: 90 tablet, Rfl: 3  Past Medical History: Past Medical History:  Diagnosis Date   Cardiac dysrhythmia, unspecified    Other malaise and fatigue    Unspecified congenital anomaly of heart     Tobacco Use: Social History   Tobacco Use  Smoking Status Never  Smokeless Tobacco Never    Labs: Recent Review Flowsheet Data     Labs for ITP Cardiac and Pulmonary Rehab Latest Ref Rng & Units 10/18/2014 09/09/2016 09/25/2017 12/28/2019 12/31/2020   Cholestrol <200 mg/dL 409 [phone]   LDLCALC mg/dL (calc)  213(Y) 85 98 865(H) 111(H)   HDL > OR = 40 mg/dL 59 63 54 59 55   Trlycerides <150 mg/dL 51 47 51 65 69   Hemoglobin A1c <5.7 % of total Hgb - - 4.7 4.5 4.7        Exercise Target Goals: Exercise Program Goal: Individual exercise prescription set using results from initial 6 min walk test and THRR while considering  patient's activity barriers and safety.   Exercise Prescription Goal: Initial exercise prescription builds to 30-45 minutes a day of aerobic activity, 2-3 days per week.  Home exercise guidelines will be given to patient during program as part of exercise prescription that the participant will acknowledge.   Education: Aerobic Exercise: - Group verbal and visual presentation on the components of exercise prescription. Introduces F.I.T.T principle from ACSM for exercise prescriptions.  Reviews F.I.T.T. principles of aerobic exercise including progression. Written material given at graduation. Flowsheet Row Cardiac Rehab from 03/13/2021 in [facility] Cardiac and Pulmonary Rehab  Education need identified 03/05/21  Date 03/13/21  Educator AS       Education: Resistance Exercise: - Group verbal and visual presentation on the components of exercise prescription. Introduces F.I.T.T principle from ACSM for exercise prescriptions  Reviews F.I.T.T. principles of resistance exercise including progression. Written material given at graduation.    Education: Exercise & Equipment Safety: - Individual verbal instruction and demonstration of equipment use and safety with use of the  equipment. Flowsheet Row Cardiac Rehab from 03/13/2021 in [facility] Cardiac and Pulmonary Rehab  Education need identified 03/05/21  Date 03/05/21  Educator KL  Instruction Review Code 1- Verbalizes Understanding       Education: Exercise Physiology & General Exercise Guidelines: - Group verbal and written instruction with models to review the exercise physiology of the cardiovascular system and associated  critical values. Provides general exercise guidelines with specific guidelines to those with heart or lung disease.  Flowsheet Row Cardiac Rehab from 03/13/2021 in [facility] Cardiac and Pulmonary Rehab  Education need identified 03/05/21  Date 03/06/21  Educator AS  Instruction Review Code 1- Verbalizes Understanding       Education: Flexibility, Balance, Mind/Body Relaxation: - Group verbal and visual presentation with interactive activity on the components of exercise prescription. Introduces F.I.T.T principle from ACSM for exercise prescriptions. Reviews F.I.T.T. principles of flexibility and balance exercise training including progression. Also discusses the mind body connection.  Reviews various relaxation techniques to help reduce and manage stress (i.e. Deep breathing, progressive muscle relaxation, and visualization). Balance handout provided to take home. Written material given at graduation.   Activity Barriers & Risk Stratification:  Activity Barriers & Cardiac Risk Stratification - 03/05/21 0920       Activity Barriers & Cardiac Risk Stratification   Activity Barriers None    Cardiac Risk Stratification Moderate             6 Minute Walk:  6 Minute Walk     Row Name 03/05/21 0920         6 Minute Walk   Phase Initial     Distance 1265 feet     Walk Time 6 minutes     # of Rest Breaks 0     MPH 2.39     METS 3.77     RPE 7     Perceived Dyspnea  0     VO2 Peak 13.2     Symptoms No     Resting HR 72 bpm     Resting BP 112/58     Resting Oxygen Saturation  98 %     Exercise Oxygen Saturation  during 6 min walk 100 %     Max Ex. HR 88 bpm     Max Ex. BP 124/72     2 Minute Post BP 108/76              Oxygen Initial Assessment:   Oxygen Re-Evaluation:   Oxygen Discharge (Final Oxygen Re-Evaluation):   Initial Exercise Prescription:  Initial Exercise Prescription - 03/05/21 0900       Date of Initial Exercise RX and Referring Provider   Date  03/05/21    Referring Provider End, Cristal Deer MD      Treadmill   MPH 2.5    Grade 1.5    Minutes 15    METs 3.43      NuStep   Level 3    SPM 80    Minutes 15    METs 3.7      REL-XR   Level 3    Speed 50    Minutes 15    METs 3.7      Prescription Details   Frequency (times per week) 2    Duration Progress to 30 minutes of continuous aerobic without signs/symptoms of physical distress      Intensity   THRR 40-80% of Max Heartrate 110-149    Ratings of Perceived Exertion 11-13    Perceived  Dyspnea 0-4      Progression   Progression Continue to progress workloads to maintain intensity without signs/symptoms of physical distress.      Resistance Training   Training Prescription Yes    Weight 4 lb    Reps 10-15             Perform Capillary Blood Glucose checks as needed.  Exercise Prescription Changes:   Exercise Prescription Changes     Row Name 03/05/21 0900 03/13/21 1300           Response to Exercise   Blood Pressure (Admit) 112/78 102/64      Blood Pressure (Exercise) 124/72 142/70      Blood Pressure (Exit) 108/76 112/68      Heart Rate (Admit) 72 bpm 91 bpm      Heart Rate (Exercise) 88 bpm 150 bpm      Heart Rate (Exit) 68 bpm 84 bpm      Oxygen Saturation (Admit) 98 % --      Oxygen Saturation (Exercise) 100 % --      Oxygen Saturation (Exit) 98 % --      Rating of Perceived Exertion (Exercise) 7 15      Perceived Dyspnea (Exercise) 0 --      Symptoms none none      Comments walk test results second day      Duration -- Continue with 30 min of aerobic exercise without signs/symptoms of physical distress.      Intensity -- THRR unchanged        Progression   Progression -- Continue to progress workloads to maintain intensity without signs/symptoms of physical distress.      Average METs -- 5.21        Resistance Training   Training Prescription -- Yes      Weight -- 4 lb      Reps -- 10-15        Treadmill   MPH -- 3       Grade -- 1.5      Minutes -- 15      METs -- 3.92        NuStep   Level -- 7      Minutes -- 15      METs -- 6.5               Exercise Comments:   Exercise Goals and Review:   Exercise Goals     Row Name 03/05/21 0937             Exercise Goals   Increase Physical Activity Yes       Intervention Provide advice, education, support and counseling about physical activity/exercise needs.;Develop an individualized exercise prescription for aerobic and resistive training based on initial evaluation findings, risk stratification, comorbidities and participant's personal goals.       Expected Outcomes Short Term: Attend rehab on a regular basis to increase amount of physical activity.;Long Term: Add in home exercise to make exercise part of routine and to increase amount of physical activity.;Long Term: Exercising regularly at least 3-5 days a week.       Increase Strength and Stamina Yes       Intervention Provide advice, education, support and counseling about physical activity/exercise needs.;Develop an individualized exercise prescription for aerobic and resistive training based on initial evaluation findings, risk stratification, comorbidities and participant's personal goals.       Expected Outcomes Short Term: Increase workloads from initial exercise prescription  for resistance, speed, and METs.;Short Term: Perform resistance training exercises routinely during rehab and add in resistance training at home;Long Term: Improve cardiorespiratory fitness, muscular endurance and strength as measured by increased METs and functional capacity ( )       Able to understand and use rate of perceived exertion (RPE) scale Yes       Intervention Provide education and explanation on how to use RPE scale       Expected Outcomes Short Term: Able to use RPE daily in rehab to express subjective intensity level;Long Term:  Able to use RPE to guide intensity level when exercising independently        Able to understand and use Dyspnea scale Yes       Intervention Provide education and explanation on how to use Dyspnea scale       Expected Outcomes Short Term: Able to use Dyspnea scale daily in rehab to express subjective sense of shortness of breath during exertion;Long Term: Able to use Dyspnea scale to guide intensity level when exercising independently       Knowledge and understanding of Target Heart Rate Range (THRR) Yes       Intervention Provide education and explanation of THRR including how the numbers were predicted and where they are located for reference       Expected Outcomes Short Term: Able to state/look up THRR;Long Term: Able to use THRR to govern intensity when exercising independently;Short Term: Able to use daily as guideline for intensity in rehab       Able to check pulse independently Yes       Intervention Provide education and demonstration on how to check pulse in carotid and radial arteries.;Review the importance of being able to check your own pulse for safety during independent exercise       Expected Outcomes Short Term: Able to explain why pulse checking is important during independent exercise;Long Term: Able to check pulse independently and accurately       Understanding of Exercise Prescription Yes       Intervention Provide education, explanation, and written materials on patient's individual exercise prescription       Expected Outcomes Short Term: Able to explain program exercise prescription;Long Term: Able to explain home exercise prescription to exercise independently                Exercise Goals Re-Evaluation :  Exercise Goals Re-Evaluation     Row Name 03/06/21 1713 03/13/21 1353           Exercise Goal Re-Evaluation   Exercise Goals Review Increase Physical Activity;Understanding of Exercise Prescription;Knowledge and understanding of Target Heart Rate Range (THRR);Able to understand and use rate of perceived exertion (RPE)  scale;Increase Strength and Stamina;Able to check pulse independently Increase Physical Activity;Increase Strength and Stamina      Comments Reviewed RPE and dyspnea scales, THR and program prescription with pt today.  Pt voiced understanding and was given a copy of goals to take home. Isac has tolerated exercise well in his first two sessions.  He works in [employer] range.  Staff will monitor.      Expected Outcomes Short: Use RPE daily to regulate intensity. Long: Follow program prescription in THR. Short: attend consistently Long: improve overall stamina               Discharge Exercise Prescription (Final Exercise Prescription Changes):  Exercise Prescription Changes - 03/13/21 1300       Response to Exercise   Blood Pressure (  Admit) 102/64    Blood Pressure (Exercise) 142/70    Blood Pressure (Exit) 112/68    Heart Rate (Admit) 91 bpm    Heart Rate (Exercise) 150 bpm    Heart Rate (Exit) 84 bpm    Rating of Perceived Exertion (Exercise) 15    Symptoms none    Comments second day    Duration Continue with 30 min of aerobic exercise without signs/symptoms of physical distress.    Intensity THRR unchanged      Progression   Progression Continue to progress workloads to maintain intensity without signs/symptoms of physical distress.    Average METs 5.21      Resistance Training   Training Prescription Yes    Weight 4 lb    Reps 10-15      Treadmill   MPH 3    Grade 1.5    Minutes 15    METs 3.92      NuStep   Level 7    Minutes 15    METs 6.5             Nutrition:  Target Goals: Understanding of nutrition guidelines, daily intake of sodium 1500mg , cholesterol 200mg , calories 30% from fat and 7% or less from saturated fats, daily to have 5 or more servings of fruits and vegetables.  Education: All About Nutrition: -Group instruction provided by verbal, written material, interactive activities, discussions, models, and posters to present general guidelines for  heart healthy nutrition including fat, fiber, MyPlate, the role of sodium in heart healthy nutrition, utilization of the nutrition label, and utilization of this knowledge for meal planning. Follow up email sent as well. Written material given at graduation.   Biometrics:  Pre Biometrics - 03/05/21 0919       Pre Biometrics   Height 5' 8.5" (1.74 m)    Weight 179 lb 11.2 oz (81.5 kg)    BMI (Calculated) 26.92    Single Leg Stand 30 seconds              Nutrition Therapy Plan and Nutrition Goals:   Nutrition Assessments:  MEDIFICTS Score Key: ?70 Need to make dietary changes  40-70 Heart Healthy Diet ? 40 Therapeutic Level Cholesterol Diet  Flowsheet Row Cardiac Rehab from 03/05/2021 in [facility] Cardiac and Pulmonary Rehab  Picture Your Plate Total Score on Admission 57      Picture Your Plate Scores: <16 Unhealthy dietary pattern with much room for improvement. 41-50 Dietary pattern unlikely to meet recommendations for good health and room for improvement. 51-60 More healthful dietary pattern, with some room for improvement.  >60 Healthy dietary pattern, although there may be some specific behaviors that could be improved.    Nutrition Goals Re-Evaluation:   Nutrition Goals Discharge (Final Nutrition Goals Re-Evaluation):   Psychosocial: Target Goals: Acknowledge presence or absence of significant depression and/or stress, maximize coping skills, provide positive support system. Participant is able to verbalize types and ability to use techniques and skills needed for reducing stress and depression.   Education: Stress, Anxiety, and Depression - Group verbal and visual presentation to define topics covered.  Reviews how body is impacted by stress, anxiety, and depression.  Also discusses healthy ways to reduce stress and to treat/manage anxiety and depression.  Written material given at graduation. Flowsheet Row Cardiac Rehab from 03/13/2021 in [facility] Cardiac and  Pulmonary Rehab  Education need identified 03/05/21       Education: Sleep Hygiene -Provides group verbal and written instruction about how sleep  can affect your health.  Define sleep hygiene, discuss sleep cycles and impact of sleep habits. Review good sleep hygiene tips.    Initial Review & Psychosocial Screening:  Initial Psych Review & Screening - 02/22/21 1511       Initial Review   Current issues with None Identified      Family Dynamics   Good Support System? Yes   family     Barriers   Psychosocial barriers to participate in program There are no identifiable barriers or psychosocial needs.;The patient should benefit from training in stress management and relaxation.      Screening Interventions   Interventions Encouraged to exercise;Provide feedback about the scores to participant;To provide support and resources with identified psychosocial needs    Expected Outcomes Short Term goal: Utilizing psychosocial counselor, staff and physician to assist with identification of specific Stressors or current issues interfering with healing process. Setting desired goal for each stressor or current issue identified.;Long Term Goal: Stressors or current issues are controlled or eliminated.;Short Term goal: Identification and review with participant of any Quality of Life or Depression concerns found by scoring the questionnaire.;Long Term goal: The participant improves quality of Life and PHQ9 Scores as seen by post scores and/or verbalization of changes             Quality of Life Scores:   Quality of Life - 03/05/21 0918       Quality of Life   Select Quality of Life      Quality of Life Scores   Health/Function Pre 28.03 %    Socioeconomic Pre 26.88 %    Psych/Spiritual Pre 29.29 %    Family Pre 29.5 %    GLOBAL Pre 28.23 %            Scores of 19 and below usually indicate a poorer quality of life in these areas.  A difference of  2-3 points is a clinically  meaningful difference.  A difference of 2-3 points in the total score of the Quality of Life Index has been associated with significant improvement in overall quality of life, self-image, physical symptoms, and general health in studies assessing change in quality of life.  PHQ-9: Recent Review Flowsheet Data     Depression screen [facility] 2/9 03/05/2021 12/31/2020 12/28/2019 09/11/2016 10/18/2014   Decreased Interest 0 0 0 0 0   Down, Depressed, Hopeless 0 0 0 0 0   PHQ - 2 Score 0 0 0 0 0   Altered sleeping 0 0 - 1 -   Tired, decreased energy 0 0 - 1 -   Change in appetite 0 0 - 0 -   Feeling bad or failure about yourself  0 0 - 0 -   Trouble concentrating 0 0 - 0 -   Moving slowly or fidgety/restless 0 0 - 0 -   Suicidal thoughts 0 0 - 0 -   PHQ-9 Score 0 0 - 2 -   Difficult doing work/chores Not difficult at all Not difficult at all - Not difficult at all -      Interpretation of Total Score  Total Score Depression Severity:  1-4 = Minimal depression, 5-9 = Mild depression, 10-14 = Moderate depression, 15-19 = Moderately severe depression, 20-27 = Severe depression   Psychosocial Evaluation and Intervention:  Psychosocial Evaluation - 02/22/21 1520       Psychosocial Evaluation & Interventions   Interventions Encouraged to exercise with the program and follow exercise prescription  Comments Jancarlos reports feeling well after his stent. 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 His blockage came as a surprise as all his past physicals have been without significant findings. He started noticing some shortness of breath during work and then some chest discomfort so he reached out to his MD. After the stent he feels like he did prior to the start of his issues. He does not report any stress concerns at this time and states he sleeps great. He wants to maintain a heart healthy lifestyle and it motivated to work hard    Expected Outcomes Short: attend cardiac rehab for education  and exercise. Long:develop and maintain positive self care habits.    Continue Psychosocial Services  Follow up required by staff             Psychosocial Re-Evaluation:   Psychosocial Discharge (Final Psychosocial Re-Evaluation):   Vocational Rehabilitation: Provide vocational rehab assistance to qualifying candidates.   Vocational Rehab Evaluation & Intervention:  Vocational Rehab - 02/22/21 1511       Initial Vocational Rehab Evaluation & Intervention   Assessment shows need for Vocational Rehabilitation No             Education: Education Goals: Education classes will be provided on a variety of topics geared toward better understanding of heart health and risk factor modification. Participant will state understanding/return demonstration of topics presented as noted by education test scores.  Learning Barriers/Preferences:  Learning Barriers/Preferences - 02/22/21 1511       Learning Barriers/Preferences   Learning Barriers None    Learning Preferences None             General Cardiac Education Topics:  AED/CPR: - Group verbal and written instruction with the use of models to demonstrate the basic use of the AED with the basic ABC's of resuscitation.   Anatomy and Cardiac Procedures: - Group verbal and visual presentation and models provide information about basic cardiac anatomy and function. Reviews the testing methods done to diagnose heart disease and the outcomes of the test results. Describes the treatment choices: Medical Management, Angioplasty, or Coronary Bypass Surgery for treating various heart conditions including Myocardial Infarction, Angina, Valve Disease, and Cardiac Arrhythmias.  Written material given at graduation.   Medication Safety: - Group verbal and visual instruction to review commonly prescribed medications for heart and lung disease. Reviews the medication, class of the drug, and side effects. Includes the steps to properly  store meds and maintain the prescription regimen.  Written material given at graduation.   Intimacy: - Group verbal instruction through game format to discuss how heart and lung disease can affect sexual intimacy. Written material given at graduation.. Flowsheet Row Cardiac Rehab from 03/13/2021 in [facility] Cardiac and Pulmonary Rehab  Date 03/13/21  Educator AS  Instruction Review Code 1- Verbalizes Understanding       Know Your Numbers and Heart Failure: - Group verbal and visual instruction to discuss disease risk factors for cardiac and pulmonary disease and treatment options.  Reviews associated critical values for Overweight/Obesity, Hypertension, Cholesterol, and Diabetes.  Discusses basics of heart failure: signs/symptoms and treatments.  Introduces Heart Failure Zone chart for action plan for heart failure.  Written material given at graduation.   Infection Prevention: - Provides verbal and written material to individual with discussion of infection control including proper hand washing and proper equipment cleaning during exercise session. Flowsheet Row Cardiac Rehab from 03/13/2021 in [facility] Cardiac and Pulmonary Rehab  Education need identified 03/05/21  Date 03/05/21  Educator KL  Instruction Review Code 1- Verbalizes Understanding       Falls Prevention: - Provides verbal and written material to individual with discussion of falls prevention and safety. Flowsheet Row Cardiac Rehab from 03/13/2021 in [facility] Cardiac and Pulmonary Rehab  Education need identified 03/05/21  Date 03/05/21  Educator KL  Instruction Review Code 1- Verbalizes Understanding       Other: -Provides group and verbal instruction on various topics (see comments)   Knowledge Questionnaire Score:  Knowledge Questionnaire Score - 03/05/21 1610       Knowledge Questionnaire Score   Pre Score 22/26: Exercise, Smoking, Depression             Core Components/Risk Factors/Patient Goals at  Admission:  Personal Goals and Risk Factors at Admission - 03/05/21 0937       Core Components/Risk Factors/Patient Goals on Admission    Weight Management Yes;Weight Loss    Intervention Weight Management: Develop a combined nutrition and exercise program designed to reach desired caloric intake, while maintaining appropriate intake of nutrient and fiber, sodium and fats, and appropriate energy expenditure required for the weight goal.;Weight Management: Provide education and appropriate resources to help participant work on and attain dietary goals.;Weight Management/Obesity: Establish reasonable short term and long term weight goals.    Admit Weight 179 lb (81.2 kg)    Goal Weight: Short Term 175 lb (79.4 kg)    Goal Weight: Long Term 170 lb (77.1 kg)    Expected Outcomes Short Term: Continue to assess and modify interventions until short term weight is achieved;Long Term: Adherence to nutrition and physical activity/exercise program aimed toward attainment of established weight goal;Weight Loss: Understanding of general recommendations for a balanced deficit meal plan, which promotes 1-2 lb weight loss per week and includes a negative energy balance of [id number] kcal/d;Understanding recommendations for meals to include 15-35% energy as protein, 25-35% energy from fat, 35-60% energy from carbohydrates, less than  of dietary cholesterol, 20-35 gm of total fiber daily;Understanding of distribution of calorie intake throughout the day with the consumption of 4-5 meals/snacks    Lipids Yes    Intervention Provide education and support for participant on nutrition & aerobic/resistive exercise along with prescribed medications to achieve LDL 70mg , HDL >40mg .    Expected Outcomes Short Term: Participant states understanding of desired cholesterol values and is compliant with medications prescribed. Participant is following exercise prescription and nutrition guidelines.;Long Term: Cholesterol  controlled with medications as prescribed, with individualized exercise RX and with personalized nutrition plan. Value goals: LDL < , HDL > 40 mg.             Education:Diabetes - Individual verbal and written instruction to review signs/symptoms of diabetes, desired ranges of glucose level fasting, after meals and with exercise. Acknowledge that pre and post exercise glucose checks will be done for 3 sessions at entry of program.   Core Components/Risk Factors/Patient Goals Review:    Core Components/Risk Factors/Patient Goals at Discharge (Final Review):    ITP Comments:  ITP Comments     Row Name 02/22/21 1517 03/05/21 0917 03/06/21 1710 03/20/21 0724     ITP Comments Initial telephone orientation completed. Diagnosis can be found in [facility] 9/27. EP orientation scheduled for Tuesday 10/18 at 8am. Completed and gym orientation. Initial ITP created and sent for review to Dr. Bethann Punches, Medical Director. First full day of exercise!  Patient was oriented to gym and equipment including functions,  settings, policies, and procedures.  Patient's individual exercise prescription and treatment plan were reviewed.  All starting workloads were established based on the results of the 6 minute walk test done at initial orientation visit.  The plan for exercise progression was also introduced and progression will be customized based on patient's performance and goals. 30 Day review completed. Medical Director ITP review done, changes made as directed, and signed approval by Medical Director.   New to program             Comments:  30 Day review completed. Medical Director ITP review done, changes made as directed, and signed approval by Medical Director.

## 2021-03-20 NOTE — Progress Notes (Signed)
Daily Session Note  Patient Details  Name: John Christensen MRN: [id number] Date of Birth: Feb 21, 1971 Referring Provider:   Flowsheet Row Cardiac Rehab from 03/05/2021 in [facility] Cardiac and Pulmonary Rehab  Referring Provider End, Harrell Gave MD       Encounter Date: 03/20/2021  Check In:  Session Check In - 03/20/21 1755       Check-In   Supervising physician immediately available to respond to emergencies See telemetry face sheet for immediately available ER MD    Location [facility]-Cardiac & Pulmonary Rehab    Staff Present Nyoka Cowden, RN, BSN, Ardeth Sportsman, RDN, Tawanna Solo, MS, ASCM CEP, Exercise Physiologist;Joseph Tessie Fass, [place]    Virtual Visit No    Medication changes reported     No    Fall or balance concerns reported    No    Tobacco Cessation No Change    Warm-up and Cool-down Performed on first and last piece of equipment    Resistance Training Performed Yes    VAD Patient? No      Pain Assessment   Currently in Pain? No/denies                Social History   Tobacco Use  Smoking Status Never  Smokeless Tobacco Never    Goals Met:  Independence with exercise equipment Exercise tolerated well No report of concerns or symptoms today  Goals Unmet:  Not Applicable  Comments: Pt able to follow exercise prescription today without complaint.  Will continue to monitor for progression.    Dr. Emily Filbert is Medical Director for [place].  Dr. Ottie Glazier is Medical Director for [facility] Pulmonary Rehabilitation.

## 2021-03-25 ENCOUNTER — Encounter: Payer: BC Managed Care – PPO | Admitting: *Deleted

## 2021-03-25 ENCOUNTER — Other Ambulatory Visit: Payer: Self-pay

## 2021-03-25 DIAGNOSIS — Z955 Presence of coronary angioplasty implant and graft: Secondary | ICD-10-CM

## 2021-03-25 NOTE — Progress Notes (Signed)
Completed initial RD consultation ?

## 2021-03-25 NOTE — Progress Notes (Signed)
Daily Session Note  Patient Details  Name: John Christensen MRN: [id number] Date of Birth: September 05, 1970 Referring Provider:   Flowsheet Row Cardiac Rehab from 03/05/2021 in [facility] Cardiac and Pulmonary Rehab  Referring Provider End, Harrell Gave MD       Encounter Date: 03/25/2021  Check In:  Session Check In - 03/25/21 1702       Check-In   Supervising physician immediately available to respond to emergencies See telemetry face sheet for immediately available ER MD    Location [facility]-Cardiac & Pulmonary Rehab    Staff Present Renita Papa, RN BSN;Joseph [place], RCP,RRT,BSRT;Kara [place], [place], ASCM CEP, Exercise Physiologist    Virtual Visit No    Medication changes reported     No    Fall or balance concerns reported    No    Warm-up and Cool-down Performed on first and last piece of equipment    Resistance Training Performed Yes    VAD Patient? No    PAD/SET Patient? No      Pain Assessment   Currently in Pain? No/denies                Social History   Tobacco Use  Smoking Status Never  Smokeless Tobacco Never    Goals Met:  Independence with exercise equipment Exercise tolerated well No report of concerns or symptoms today Strength training completed today  Goals Unmet:  Not Applicable  Comments: Pt able to follow exercise prescription today without complaint.  Will continue to monitor for progression.    Dr. Emily Filbert is Medical Director for [place].  Dr. Ottie Glazier is Medical Director for [facility] Pulmonary Rehabilitation.

## 2021-03-27 ENCOUNTER — Encounter: Payer: BC Managed Care – PPO | Admitting: *Deleted

## 2021-03-27 ENCOUNTER — Other Ambulatory Visit: Payer: Self-pay

## 2021-03-27 DIAGNOSIS — Z955 Presence of coronary angioplasty implant and graft: Secondary | ICD-10-CM | POA: Diagnosis not present

## 2021-03-27 NOTE — Progress Notes (Signed)
Daily Session Note  Patient Details  Name: John Christensen MRN: [id number] Date of Birth: Jul 25, 1970 Referring Provider:   Flowsheet Row Cardiac Rehab from 03/05/2021 in [facility] Cardiac and Pulmonary Rehab  Referring Provider End, Harrell Gave MD       Encounter Date: 03/27/2021  Check In:  Session Check In - 03/27/21 1649       Check-In   Supervising physician immediately available to respond to emergencies See telemetry face sheet for immediately available ER MD    Location [facility]-Cardiac & Pulmonary Rehab    Staff Present Renita Papa, RN Margurite Auerbach, MS, ASCM CEP, Exercise Physiologist;Joseph Tessie Fass, [place]    Virtual Visit No    Medication changes reported     No    Fall or balance concerns reported    No    Warm-up and Cool-down Performed on first and last piece of equipment    Resistance Training Performed Yes    VAD Patient? No    PAD/SET Patient? No      Pain Assessment   Currently in Pain? No/denies                Social History   Tobacco Use  Smoking Status Never  Smokeless Tobacco Never    Goals Met:  Independence with exercise equipment Exercise tolerated well No report of concerns or symptoms today Strength training completed today  Goals Unmet:  Not Applicable  Comments: Pt able to follow exercise prescription today without complaint.  Will continue to monitor for progression.    Dr. Emily Filbert is Medical Director for [place].  Dr. Ottie Glazier is Medical Director for [facility] Pulmonary Rehabilitation.

## 2021-04-01 ENCOUNTER — Other Ambulatory Visit: Payer: Self-pay

## 2021-04-01 ENCOUNTER — Encounter: Payer: BC Managed Care – PPO | Admitting: *Deleted

## 2021-04-01 DIAGNOSIS — Z955 Presence of coronary angioplasty implant and graft: Secondary | ICD-10-CM | POA: Diagnosis not present

## 2021-04-01 NOTE — Progress Notes (Signed)
Daily Session Note  Patient Details  Name: John Christensen MRN: [id number] Date of Birth: 04-21-1971 Referring Provider:   Flowsheet Row Cardiac Rehab from 03/05/2021 in [facility] Cardiac and Pulmonary Rehab  Referring Provider End, Harrell Gave MD       Encounter Date: 04/01/2021  Check In:  Session Check In - 04/01/21 1700       Check-In   Supervising physician immediately available to respond to emergencies See telemetry face sheet for immediately available ER MD    Location [facility]-Cardiac & Pulmonary Rehab    Staff Present Renita Papa, RN Margurite Auerbach, MS, ASCM CEP, Exercise Physiologist;Joseph Tessie Fass, [place]    Virtual Visit No    Medication changes reported     No    Fall or balance concerns reported    No    Warm-up and Cool-down Performed on first and last piece of equipment    Resistance Training Performed Yes    VAD Patient? No    PAD/SET Patient? No      Pain Assessment   Currently in Pain? No/denies                Social History   Tobacco Use  Smoking Status Never  Smokeless Tobacco Never    Goals Met:  Independence with exercise equipment Exercise tolerated well No report of concerns or symptoms today Strength training completed today  Goals Unmet:  Not Applicable  Comments: Pt able to follow exercise prescription today without complaint.  Will continue to monitor for progression.    Dr. Emily Filbert is Medical Director for [place].  Dr. Ottie Glazier is Medical Director for [facility] Pulmonary Rehabilitation.

## 2021-04-03 ENCOUNTER — Encounter: Payer: BC Managed Care – PPO | Admitting: *Deleted

## 2021-04-03 ENCOUNTER — Other Ambulatory Visit: Payer: Self-pay

## 2021-04-03 DIAGNOSIS — Z955 Presence of coronary angioplasty implant and graft: Secondary | ICD-10-CM | POA: Diagnosis not present

## 2021-04-03 NOTE — Progress Notes (Signed)
Daily Session Note  Patient Details  Name: John Christensen MRN: [id number] Date of Birth: 1970/11/12 Referring Provider:   Flowsheet Row Cardiac Rehab from 03/05/2021 in [facility] Cardiac and Pulmonary Rehab  Referring Provider End, Harrell Gave MD       Encounter Date: 04/03/2021  Check In:  Session Check In - 04/03/21 1703       Check-In   Supervising physician immediately available to respond to emergencies See telemetry face sheet for immediately available ER MD    Location [facility]-Cardiac & Pulmonary Rehab    Staff Present Renita Papa, RN Margurite Auerbach, MS, ASCM CEP, Exercise Physiologist;Joseph Tessie Fass, [place]    Virtual Visit No    Medication changes reported     No    Fall or balance concerns reported    No    Warm-up and Cool-down Performed on first and last piece of equipment    Resistance Training Performed Yes    VAD Patient? No    PAD/SET Patient? No      Pain Assessment   Currently in Pain? No/denies                Social History   Tobacco Use  Smoking Status Never  Smokeless Tobacco Never    Goals Met:  Independence with exercise equipment Exercise tolerated well No report of concerns or symptoms today Strength training completed today  Goals Unmet:  Not Applicable  Comments: Pt able to follow exercise prescription today without complaint.  Will continue to monitor for progression.    Dr. Emily Filbert is Medical Director for [place].  Dr. Ottie Glazier is Medical Director for [facility] Pulmonary Rehabilitation.

## 2021-04-08 ENCOUNTER — Other Ambulatory Visit: Payer: Self-pay

## 2021-04-08 ENCOUNTER — Encounter: Payer: BC Managed Care – PPO | Admitting: *Deleted

## 2021-04-08 DIAGNOSIS — Z955 Presence of coronary angioplasty implant and graft: Secondary | ICD-10-CM | POA: Diagnosis not present

## 2021-04-08 NOTE — Progress Notes (Signed)
Daily Session Note  Patient Details  Name: John Christensen MRN: [id number] Date of Birth: [birth year] Referring Provider:   Flowsheet Row Cardiac Rehab from 03/05/2021 in [facility] Cardiac and Pulmonary Rehab  Referring Provider End, Harrell Gave MD       Encounter Date: 04/08/2021  Check In:  Session Check In - 04/08/21 1717       Check-In   Supervising physician immediately available to respond to emergencies See telemetry face sheet for immediately available ER MD    Location [facility]-Cardiac & Pulmonary Rehab    Staff Present Renita Papa, RN BSN;Joseph [place], RCP,RRT,BSRT;Kara [place], [place], ASCM CEP, Exercise Physiologist    Virtual Visit No    Medication changes reported     No    Fall or balance concerns reported    No    Warm-up and Cool-down Performed on first and last piece of equipment    Resistance Training Performed Yes    VAD Patient? No    PAD/SET Patient? No      Pain Assessment   Currently in Pain? No/denies                Social History   Tobacco Use  Smoking Status Never  Smokeless Tobacco Never    Goals Met:  Independence with exercise equipment Exercise tolerated well No report of concerns or symptoms today Strength training completed today  Goals Unmet:  Not Applicable  Comments: Pt able to follow exercise prescription today without complaint.  Will continue to monitor for progression.    Dr. Emily Filbert is Medical Director for [place].  Dr. Ottie Glazier is Medical Director for [facility] Pulmonary Rehabilitation.

## 2021-04-15 ENCOUNTER — Encounter: Payer: BC Managed Care – PPO | Admitting: *Deleted

## 2021-04-15 ENCOUNTER — Other Ambulatory Visit: Payer: Self-pay

## 2021-04-15 DIAGNOSIS — Z955 Presence of coronary angioplasty implant and graft: Secondary | ICD-10-CM

## 2021-04-15 NOTE — Progress Notes (Signed)
Daily Session Note  Patient Details  Name: John Christensen MRN: [id number] Date of Birth: 1971-03-04 Referring Provider:   Flowsheet Row Cardiac Rehab from 03/05/2021 in [facility] Cardiac and Pulmonary Rehab  Referring Provider End, Harrell Gave MD       Encounter Date: 04/15/2021  Check In:  Session Check In - 04/15/21 1711       Check-In   Supervising physician immediately available to respond to emergencies See telemetry face sheet for immediately available ER MD    Location [facility]-Cardiac & Pulmonary Rehab    Staff Present Renita Papa, RN BSN;Joseph [place], RCP,RRT,BSRT;Kara [place], [place], ASCM CEP, Exercise Physiologist    Virtual Visit No    Medication changes reported     No    Fall or balance concerns reported    No    Warm-up and Cool-down Performed on first and last piece of equipment    Resistance Training Performed Yes    VAD Patient? No    PAD/SET Patient? No      Pain Assessment   Currently in Pain? No/denies                Social History   Tobacco Use  Smoking Status Never  Smokeless Tobacco Never    Goals Met:  Independence with exercise equipment Exercise tolerated well No report of concerns or symptoms today Strength training completed today  Goals Unmet:  Not Applicable  Comments: Pt able to follow exercise prescription today without complaint.  Will continue to monitor for progression.    Dr. Emily Filbert is Medical Director for [place].  Dr. Ottie Glazier is Medical Director for [facility] Pulmonary Rehabilitation.

## 2021-04-17 ENCOUNTER — Encounter: Payer: Self-pay | Admitting: *Deleted

## 2021-04-17 ENCOUNTER — Other Ambulatory Visit: Payer: Self-pay

## 2021-04-17 DIAGNOSIS — Z955 Presence of coronary angioplasty implant and graft: Secondary | ICD-10-CM

## 2021-04-17 NOTE — Progress Notes (Signed)
Daily Session Note  Patient Details  Name: John Christensen MRN: [id number] Date of Birth: [birth year] Referring Provider:   Flowsheet Row Cardiac Rehab from 03/05/2021 in [facility] Cardiac and Pulmonary Rehab  Referring Provider End, Harrell Gave MD       Encounter Date: 04/17/2021  Check In:  Session Check In - 04/17/21 1639       Check-In   Supervising physician immediately available to respond to emergencies See telemetry face sheet for immediately available ER MD    Location [facility]-Cardiac & Pulmonary Rehab    Staff Present Birdie Sons, MPA, Nino Glow, MS, ASCM CEP, Exercise Physiologist;Joseph Tessie Fass, [place]    Virtual Visit No    Medication changes reported     No    Fall or balance concerns reported    No    Tobacco Cessation No Change    Warm-up and Cool-down Performed on first and last piece of equipment    Resistance Training Performed Yes    VAD Patient? No    PAD/SET Patient? No      Pain Assessment   Currently in Pain? No/denies                Social History   Tobacco Use  Smoking Status Never  Smokeless Tobacco Never    Goals Met:  Independence with exercise equipment Exercise tolerated well No report of concerns or symptoms today Strength training completed today  Goals Unmet:  Not Applicable  Comments: Pt able to follow exercise prescription today without complaint.  Will continue to monitor for progression.    Dr. Emily Filbert is Medical Director for [place].  Dr. Ottie Glazier is Medical Director for [facility] Pulmonary Rehabilitation.

## 2021-04-17 NOTE — Progress Notes (Signed)
Cardiac Individual Treatment Plan  Patient Details  Name: John Christensen MRN: [id number] Date of Birth: 01-Feb-1971 Referring Provider:   Flowsheet Row Cardiac Rehab from 03/05/2021 in [facility] Cardiac and Pulmonary Rehab  Referring Provider End, Harrell Gave MD       Initial Encounter Date:  Flowsheet Row Cardiac Rehab from 03/05/2021 in [facility] Cardiac and Pulmonary Rehab  Date 03/05/21       Visit Diagnosis: Status post coronary artery stent placement  Patient's Home Medications on Admission:  Current Outpatient Medications:    acetaminophen (TYLENOL) 500 MG tablet, Take 500-1,000 mg by mouth every 6 (six) hours as needed for moderate pain., Disp: , Rfl:    albuterol (VENTOLIN HFA) 108 (90 Base) MCG/ACT inhaler, Inhale 2 puffs into the lungs every 6 (six) hours as needed for wheezing or shortness of breath. (Patient not taking: Reported on 02/25/2021), Disp: 8 g, Rfl: 2   aspirin EC 81 MG tablet, Take 1 tablet (81 mg total) by mouth daily. Swallow whole., Disp: 90 tablet, Rfl: 3   atorvastatin (LIPITOR) 40 MG tablet, Take 1 tablet (40 mg total) by mouth daily., Disp: 30 tablet, Rfl: 5   clopidogrel (PLAVIX) 75 MG tablet, Take 1 tablet (75 mg total) by mouth daily with breakfast., Disp: 90 tablet, Rfl: 3   nitroGLYCERIN (NITROSTAT) 0.4 MG SL tablet, Place 1 tablet (0.4 mg total) under the tongue every 5 (five) minutes as needed for chest pain. For a maximum of 3 doses, Disp: 90 tablet, Rfl: 3  Past Medical History: Past Medical History:  Diagnosis Date   Cardiac dysrhythmia, unspecified    Other malaise and fatigue    Unspecified congenital anomaly of heart     Tobacco Use: Social History   Tobacco Use  Smoking Status Never  Smokeless Tobacco Never    Labs: Recent Review Flowsheet Data     Labs for ITP Cardiac and Pulmonary Rehab Latest Ref Rng & Units 10/18/2014 09/09/2016 09/25/2017 12/28/2019 12/31/2020   Cholestrol <200 mg/dL 174 157 165 184 182   LDLCALC mg/dL (calc)  105(H) 85 98 110(H) 111(H)   HDL > OR = 40 mg/dL 59 63 54 59 55   Trlycerides <150 mg/dL 51 47 51 65 69   Hemoglobin A1c <5.7 % of total Hgb - - 4.7 4.5 4.7        Exercise Target Goals: Exercise Program Goal: Individual exercise prescription set using results from initial 6 min walk test and THRR while considering  patient's activity barriers and safety.   Exercise Prescription Goal: Initial exercise prescription builds to 30-45 minutes a day of aerobic activity, 2-3 days per week.  Home exercise guidelines will be given to patient during program as part of exercise prescription that the participant will acknowledge.   Education: Aerobic Exercise: - Group verbal and visual presentation on the components of exercise prescription. Introduces F.I.T.T principle from ACSM for exercise prescriptions.  Reviews F.I.T.T. principles of aerobic exercise including progression. Written material given at graduation. Flowsheet Row Cardiac Rehab from 03/27/2021 in [facility] Cardiac and Pulmonary Rehab  Education need identified 03/05/21  Date 03/13/21  Educator AS       Education: Resistance Exercise: - Group verbal and visual presentation on the components of exercise prescription. Introduces F.I.T.T principle from ACSM for exercise prescriptions  Reviews F.I.T.T. principles of resistance exercise including progression. Written material given at graduation. Flowsheet Row Cardiac Rehab from 03/27/2021 in [facility] Cardiac and Pulmonary Rehab  Date 03/20/21  Educator as  Instruction Review Code 1-  Verbalizes Understanding        Education: Exercise & Equipment Safety: - Individual verbal instruction and demonstration of equipment use and safety with use of the equipment. Flowsheet Row Cardiac Rehab from 03/27/2021 in [facility] Cardiac and Pulmonary Rehab  Education need identified 03/05/21  Date 03/05/21  Educator [place]  Instruction Review Code 1- Verbalizes Understanding       Education: Exercise  Physiology & General Exercise Guidelines: - Group verbal and written instruction with models to review the exercise physiology of the cardiovascular system and associated critical values. Provides general exercise guidelines with specific guidelines to those with heart or lung disease.  Flowsheet Row Cardiac Rehab from 03/27/2021 in [facility] Cardiac and Pulmonary Rehab  Education need identified 03/05/21  Date 03/06/21  Educator AS  Instruction Review Code 1- Verbalizes Understanding       Education: Flexibility, Balance, Mind/Body Relaxation: - Group verbal and visual presentation with interactive activity on the components of exercise prescription. Introduces F.I.T.T principle from ACSM for exercise prescriptions. Reviews F.I.T.T. principles of flexibility and balance exercise training including progression. Also discusses the mind body connection.  Reviews various relaxation techniques to help reduce and manage stress (i.e. Deep breathing, progressive muscle relaxation, and visualization). Balance handout provided to take home. Written material given at graduation. Flowsheet Row Cardiac Rehab from 03/27/2021 in [facility] Cardiac and Pulmonary Rehab  Date 03/27/21  Educator AS  Instruction Review Code 1- Verbalizes Understanding       Activity Barriers & Risk Stratification:  Activity Barriers & Cardiac Risk Stratification - 03/05/21 0920       Activity Barriers & Cardiac Risk Stratification   Activity Barriers None    Cardiac Risk Stratification Moderate             6 Minute Walk:  6 Minute Walk     Row Name 03/05/21 0920         6 Minute Walk   Phase Initial     Distance 1265 feet     Walk Time 6 minutes     # of Rest Breaks 0     MPH 2.39     METS 3.77     RPE 7     Perceived Dyspnea  0     VO2 Peak 13.2     Symptoms No     Resting HR 72 bpm     Resting BP 112/58     Resting Oxygen Saturation  98 %     Exercise Oxygen Saturation  during 6 min walk 100 %     Max Ex.  HR 88 bpm     Max Ex. BP 124/72     2 Minute Post BP 108/76              Oxygen Initial Assessment:   Oxygen Re-Evaluation:   Oxygen Discharge (Final Oxygen Re-Evaluation):   Initial Exercise Prescription:  Initial Exercise Prescription - 03/05/21 0900       Date of Initial Exercise RX and Referring Provider   Date 03/05/21    Referring Provider End, Harrell Gave MD      Treadmill   MPH 2.5    Grade 1.5    Minutes 15    METs 3.43      NuStep   Level 3    SPM 80    Minutes 15    METs 3.7      REL-XR   Level 3    Speed 50    Minutes 15  METs 3.7      Prescription Details   Frequency (times per week) 2    Duration Progress to 30 minutes of continuous aerobic without signs/symptoms of physical distress      Intensity   THRR 40-80% of Max Heartrate 110-149    Ratings of Perceived Exertion 11-13    Perceived Dyspnea 0-4      Progression   Progression Continue to progress workloads to maintain intensity without signs/symptoms of physical distress.      Resistance Training   Training Prescription Yes    Weight 4 lb    Reps 10-15             Perform Capillary Blood Glucose checks as needed.  Exercise Prescription Changes:   Exercise Prescription Changes     Row Name 03/05/21 0900 03/13/21 1300 03/25/21 0900 03/25/21 1800 04/10/21 1600     Response to Exercise   Blood Pressure (Admit) 112/78 102/64 112/60 -- 110/68   Blood Pressure (Exercise) 124/72 142/70 126/60 -- --   Blood Pressure (Exit) 108/76 112/68 124/72 -- 112/64   Heart Rate (Admit) 72 bpm 91 bpm 87 bpm -- 75 bpm   Heart Rate (Exercise) 88 bpm 150 bpm 143 bpm -- 151 bpm   Heart Rate (Exit) 68 bpm 84 bpm 98 bpm -- 90 bpm   Oxygen Saturation (Admit) 98 % -- -- -- --   Oxygen Saturation (Exercise) 100 % -- -- -- --   Oxygen Saturation (Exit) 98 % -- -- -- --   Rating of Perceived Exertion (Exercise) 7 15 13  -- 13   Perceived Dyspnea (Exercise) 0 -- -- -- --   Symptoms none none  none -- none   Comments walk test results second day -- -- --   Duration -- Continue with 30 min of aerobic exercise without signs/symptoms of physical distress. Continue with 30 min of aerobic exercise without signs/symptoms of physical distress. -- Continue with 30 min of aerobic exercise without signs/symptoms of physical distress.   Intensity -- THRR unchanged THRR unchanged -- THRR unchanged     Progression   Progression -- Continue to progress workloads to maintain intensity without signs/symptoms of physical distress. Continue to progress workloads to maintain intensity without signs/symptoms of physical distress. -- Continue to progress workloads to maintain intensity without signs/symptoms of physical distress.   Average METs -- 5.21 4.73 -- 5.6     Resistance Training   Training Prescription -- Yes Yes -- Yes   Weight -- 4 lb 6 lb -- 6 lb   Reps -- 10-15 10-15 -- 10-15     Interval Training   Interval Training -- -- No -- No     Treadmill   MPH -- 3 3.3 -- 3.5   Grade -- 1.5 1.5 -- 4   Minutes -- 15 15 -- 15   METs -- 3.92 4.21 -- 5.61     NuStep   Level -- 7 7 -- --   Minutes -- 15 15 -- --   METs -- 6.5 6.3 -- --     REL-XR   Level -- -- 7 -- --   Minutes -- -- 15 -- --     Home Exercise Plan   Plans to continue exercise at -- -- -- [employer] (comment)  [employer] (comment)  [employer]   Frequency -- -- -- Add 2 additional days to program exercise sessions.  start with 1 Add 2 additional days to program exercise sessions.  start with 1   Initial Home Exercises Provided -- -- -- 03/25/21 03/25/21            Exercise Comments:   Exercise Goals and Review:   Exercise Goals     Row Name 03/05/21 0937             Exercise Goals   Increase Physical Activity Yes       Intervention Provide advice, education, support and counseling about physical activity/exercise needs.;Develop an individualized exercise prescription  for aerobic and resistive training based on initial evaluation findings, risk stratification, comorbidities and participant's personal goals.       Expected Outcomes Short Term: Attend rehab on a regular basis to increase amount of physical activity.;Long Term: Add in home exercise to make exercise part of routine and to increase amount of physical activity.;Long Term: Exercising regularly at least 3-5 days a week.       Increase Strength and Stamina Yes       Intervention Provide advice, education, support and counseling about physical activity/exercise needs.;Develop an individualized exercise prescription for aerobic and resistive training based on initial evaluation findings, risk stratification, comorbidities and participant's personal goals.       Expected Outcomes Short Term: Increase workloads from initial exercise prescription for resistance, speed, and METs.;Short Term: Perform resistance training exercises routinely during rehab and add in resistance training at home;Long Term: Improve cardiorespiratory fitness, muscular endurance and strength as measured by increased METs and functional capacity (6MWT)       Able to understand and use rate of perceived exertion (RPE) scale Yes       Intervention Provide education and explanation on how to use RPE scale       Expected Outcomes Short Term: Able to use RPE daily in rehab to express subjective intensity level;Long Term:  Able to use RPE to guide intensity level when exercising independently       Able to understand and use Dyspnea scale Yes       Intervention Provide education and explanation on how to use Dyspnea scale       Expected Outcomes Short Term: Able to use Dyspnea scale daily in rehab to express subjective sense of shortness of breath during exertion;Long Term: Able to use Dyspnea scale to guide intensity level when exercising independently       Knowledge and understanding of Target Heart Rate Range (THRR) Yes       Intervention  Provide education and explanation of THRR including how the numbers were predicted and where they are located for reference       Expected Outcomes Short Term: Able to state/look up THRR;Long Term: Able to use THRR to govern intensity when exercising independently;Short Term: Able to use daily as guideline for intensity in rehab       Able to check pulse independently Yes       Intervention Provide education and demonstration on how to check pulse in carotid and radial arteries.;Review the importance of being able to check your own pulse for safety during independent exercise       Expected Outcomes Short Term: Able to explain why pulse checking is important during independent exercise;Long Term: Able to check pulse independently and accurately       Understanding of Exercise Prescription Yes       Intervention Provide education, explanation, and written materials on patient's individual exercise prescription       Expected Outcomes Short Term: Able to explain program exercise prescription;Long Term:  Able to explain home exercise prescription to exercise independently                Exercise Goals Re-Evaluation :  Exercise Goals Re-Evaluation     Row Name 03/06/21 1713 03/13/21 1353 03/25/21 0940 03/25/21 1802 04/10/21 1603     Exercise Goal Re-Evaluation   Exercise Goals Review Increase Physical Activity;Understanding of Exercise Prescription;Knowledge and understanding of Target Heart Rate Range (THRR);Able to understand and use rate of perceived exertion (RPE) scale;Increase Strength and Stamina;Able to check pulse independently Increase Physical Activity;Increase Strength and Stamina Increase Physical Activity;Increase Strength and Stamina Increase Physical Activity;Increase Strength and Stamina;Understanding of Exercise Prescription Increase Physical Activity;Increase Strength and Stamina   Comments Reviewed RPE and dyspnea scales, THR and program prescription with pt today.  Pt voiced  understanding and was given a copy of goals to take home. John Christensen has tolerated exercise well in his first two sessions.  He works in [employer] range.  Staff will monitor. Theodoro is doing well his first couple of weeks being here. He is consistently hitting his THR each time. He has already increased his speed on the treadmill to 3.3 and his handweights to 6 lbs. Will continue to monitor. Reviewed home exercise with pt today.  Pt plans to go back to [employer] for exercise.  He plans to do cardio machines as well as weights. Reviewed THR, pulse, RPE, sign and symptoms, pulse oximetery and when to call 911 or MD.  Also discussed weather considerations and indoor options.  Pt voiced understanding. Adric is progressing well and has increased to 3.5 mph and 4 % grade on TM.  He uses 7 lb for strength training.   Expected Outcomes Short: Use RPE daily to regulate intensity. Long: Follow program prescription in THR. Short: attend consistently Long: improve overall stamina Short: Continue to build up loads on treadmill Long: Continue to increase overall MET level Short: Attend [place] again and add 1 day of exercise at home Long: Exercise independently at home at appropriate prescription Short: continue to exercise consistently Long: improve average MET level            Discharge Exercise Prescription (Final Exercise Prescription Changes):  Exercise Prescription Changes - 04/10/21 1600       Response to Exercise   Blood Pressure (Admit) 110/68    Blood Pressure (Exit) 112/64    Heart Rate (Admit) 75 bpm    Heart Rate (Exercise) 151 bpm    Heart Rate (Exit) 90 bpm    Rating of Perceived Exertion (Exercise) 13    Symptoms none    Duration Continue with 30 min of aerobic exercise without signs/symptoms of physical distress.    Intensity THRR unchanged      Progression   Progression Continue to progress workloads to maintain intensity without signs/symptoms of physical distress.    Average METs 5.6       Resistance Training   Training Prescription Yes    Weight 6 lb    Reps 10-15      Interval Training   Interval Training No      Treadmill   MPH 3.5    Grade 4    Minutes 15    METs 5.61      Home Exercise Plan   Plans to continue exercise at [employer] (comment)   [employer]   Frequency Add 2 additional days to program exercise sessions.   start with 1   Initial Home Exercises Provided 03/25/21  Nutrition:  Target Goals: Understanding of nutrition guidelines, daily intake of sodium <1576m, cholesterol <2015m calories 30% from fat and 7% or less from saturated fats, daily to have 5 or more servings of fruits and vegetables.  Education: All About Nutrition: -Group instruction provided by verbal, written material, interactive activities, discussions, models, and posters to present general guidelines for heart healthy nutrition including fat, fiber, MyPlate, the role of sodium in heart healthy nutrition, utilization of the nutrition label, and utilization of this knowledge for meal planning. Follow up email sent as well. Written material given at graduation.   Biometrics:  Pre Biometrics - 03/05/21 0919       Pre Biometrics   Height 5' 8.5" (1.74 m)    Weight 179 lb 11.2 oz (81.5 kg)    BMI (Calculated) 26.92    Single Leg Stand 30 seconds              Nutrition Therapy Plan and Nutrition Goals:  Nutrition Therapy & Goals - 03/25/21 1736       Nutrition Therapy   Diet Heart healthy, low Na    Drug/Food Interactions Statins/Certain Fruits    Protein (specify units) 65g    Fiber 30 grams    Whole Grain Foods 3 servings    Saturated Fats 12 max. grams    Fruits and Vegetables 8 servings/day    Sodium 1.5 grams      Personal Nutrition Goals   Nutrition Goal ST: swap white bread for whole wheat bread at breakfast, try out making own breakfast sandwiches LT: limit saturated fat <12g/day, eat at least 5 fruits/vegetable servings per  day, improve vegetable variety, limit processed meat < 3x/week    Comments Patient is [age].o. M admitted to rehab for stent placement. PMH of CAD. Relevent medications: lipitor. PYP: 57:  Christensen that in the morning he has a jimmy dead breakfast sandwich and two slices of white toast. He does not have a traditional lunch, he will snack on a nut mix during the day with OJ or he may have cottage cheese with fruit like apples or pears. For dinner he will have [employer] which is a meal delivery kit and a glass of 2% milk. He goes out to eat < 1-2x/week. Discussed including more whole grains like whole wheat bread which he reports liking, potentially making his own brekafast sandwiches which he can freeze in bulk to have on hand, limiting OJ <2x/day, including a variety of vegetables, and potentially having vegetables with dip like homemade tzatziki dip to throw into his rotation. Discussed heart healthy eating.      Intervention Plan   Intervention Prescribe, educate and counsel regarding individualized specific dietary modifications aiming towards targeted core components such as weight, hypertension, lipid management, diabetes, heart failure and other comorbidities.;Nutrition handout(s) given to patient.    Expected Outcomes Short Term Goal: Understand basic principles of dietary content, such as calories, fat, sodium, cholesterol and nutrients.;Short Term Goal: A plan has been developed with personal nutrition goals set during dietitian appointment.;Long Term Goal: Adherence to prescribed nutrition plan.             Nutrition Assessments:  MEDIFICTS Score Key: ?70 Need to make dietary changes  40-70 Heart Healthy Diet ? 40 Therapeutic Level Cholesterol Diet  [facility]  Picture Your Plate Total Score on Admission 57      Picture Your Plate Scores: <4<60nhealthy dietary pattern with much  room for improvement. 41-50  Dietary pattern unlikely to meet recommendations for good health and room for improvement. 51-60 More healthful dietary pattern, with some room for improvement.  >60 Healthy dietary pattern, although there may be some specific behaviors that could be improved.    Nutrition Goals Re-Evaluation:   Nutrition Goals Discharge (Final Nutrition Goals Re-Evaluation):   Psychosocial: Target Goals: Acknowledge presence or absence of significant depression and/or stress, maximize coping skills, provide positive support system. Participant is able to verbalize types and ability to use techniques and skills needed for reducing stress and depression.   Education: Stress, Anxiety, and Depression - Group verbal and visual presentation to define topics covered.  Reviews how body is impacted by stress, anxiety, and depression.  Also discusses healthy ways to reduce stress and to treat/manage anxiety and depression.  Written material given at graduation. Flowsheet Row Cardiac Rehab from 03/27/2021 in [facility] Cardiac and Pulmonary Rehab  Education need identified 03/05/21       Education: Sleep Hygiene -Provides group verbal and written instruction about how sleep can affect your health.  Define sleep hygiene, discuss sleep cycles and impact of sleep habits. Review good sleep hygiene tips.    Initial Review & Psychosocial Screening:  Initial Psych Review & Screening - 02/22/21 1511       Initial Review   Current issues with None Identified      Family Dynamics   Good Support System? Yes   family     Barriers   Psychosocial barriers to participate in program There are no identifiable barriers or psychosocial needs.;The patient should benefit from training in stress management and relaxation.      Screening Interventions   Interventions Encouraged to exercise;Provide feedback about the scores to participant;To provide support and resources with identified psychosocial needs    Expected Outcomes Short  Term goal: Utilizing psychosocial counselor, staff and physician to assist with identification of specific Stressors or current issues interfering with healing process. Setting desired goal for each stressor or current issue identified.;Long Term Goal: Stressors or current issues are controlled or eliminated.;Short Term goal: Identification and review with participant of any Quality of Life or Depression concerns found by scoring the questionnaire.;Long Term goal: The participant improves quality of Life and PHQ9 Scores as seen by post scores and/or verbalization of changes             Quality of Life Scores:   Quality of Life - 03/05/21 0918       Quality of Life   Select Quality of Life      Quality of Life Scores   Health/Function Pre 28.03 %    Socioeconomic Pre 26.88 %    Psych/Spiritual Pre 29.29 %    Family Pre 29.5 %    GLOBAL Pre 28.23 %            Scores of 19 and below usually indicate a poorer quality of life in these areas.  A difference of  2-3 points is a clinically meaningful difference.  A difference of 2-3 points in the total score of the Quality of Life Index has been associated with significant improvement in overall quality of life, self-image, physical symptoms, and general health in studies assessing change in quality of life.  PHQ-9: Recent Review Flowsheet Data     Depression screen [facility] 2/9 03/05/2021 12/31/2020 12/28/2019 09/11/2016 10/18/2014   Decreased Interest 0 0 0 0 0   Down, Depressed, Hopeless 0 0 0 0 0   PHQ -  2 Score 0 0 0 0 0   Altered sleeping 0 0 - 1 -   Tired, decreased energy 0 0 - 1 -   Change in appetite 0 0 - 0 -   Feeling bad or failure about yourself  0 0 - 0 -   Trouble concentrating 0 0 - 0 -   Moving slowly or fidgety/restless 0 0 - 0 -   Suicidal thoughts 0 0 - 0 -   PHQ-9 Score 0 0 - 2 -   Difficult doing work/chores Not difficult at all Not difficult at all - Not difficult at all -      Interpretation of Total Score   Total Score Depression Severity:  1-4 = Minimal depression, 5-9 = Mild depression, 10-14 = Moderate depression, 15-19 = Moderately severe depression, 20-27 = Severe depression   Psychosocial Evaluation and Intervention:  Psychosocial Evaluation - 02/22/21 1520       Psychosocial Evaluation & Interventions   Interventions Encouraged to exercise with the program and follow exercise prescription    Comments [place] reports feeling well after his stent. 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 His blockage came as a surprise as all his past physicals have been without significant findings. He started noticing some shortness of breath during work and then some chest discomfort so he reached out to his MD. After the stent he feels like he did prior to the start of his issues. He does not report any stress concerns at this time and states he sleeps great. He wants to maintain a heart healthy lifestyle and it motivated to work hard    Expected Outcomes Short: attend cardiac rehab for education and exercise. Long:develop and maintain positive self care habits.    Continue Psychosocial Services  Follow up required by staff             Psychosocial Re-Evaluation:  Psychosocial Re-Evaluation     [place] Name 04/01/21 1717             Psychosocial Re-Evaluation   Current issues with None Identified       Comments Patient reports no issues with their current mental states, sleep, stress, depression or anxiety. Will follow up with patient in a few weeks for any changes.       Expected Outcomes Short: Continue to exercise regularly to support mental health and notify staff of any changes. Long: maintain mental health and well being through teaching of rehab or prescribed medications independently.       Interventions Encouraged to attend Cardiac Rehabilitation for the exercise       Continue Psychosocial Services  Follow up required by staff                Psychosocial  Discharge (Final Psychosocial Re-Evaluation):  Psychosocial Re-Evaluation - 04/01/21 1717       Psychosocial Re-Evaluation   Current issues with None Identified    Comments Patient reports no issues with their current mental states, sleep, stress, depression or anxiety. Will follow up with patient in a few weeks for any changes.    Expected Outcomes Short: Continue to exercise regularly to support mental health and notify staff of any changes. Long: maintain mental health and well being through teaching of rehab or prescribed medications independently.    Interventions Encouraged to attend Cardiac Rehabilitation for the exercise    Continue Psychosocial Services  Follow up required by staff  Vocational Rehabilitation: Provide vocational rehab assistance to qualifying candidates.   Vocational Rehab Evaluation & Intervention:  Vocational Rehab - 02/22/21 1511       Initial Vocational Rehab Evaluation & Intervention   Assessment shows need for Vocational Rehabilitation No             Education: Education Goals: Education classes will be provided on a variety of topics geared toward better understanding of heart health and risk factor modification. Participant will state understanding/return demonstration of topics presented as noted by education test scores.  Learning Barriers/Preferences:  Learning Barriers/Preferences - 02/22/21 1511       Learning Barriers/Preferences   Learning Barriers None    Learning Preferences None             General Cardiac Education Topics:  AED/CPR: - Group verbal and written instruction with the use of models to demonstrate the basic use of the AED with the basic ABC's of resuscitation.   Anatomy and Cardiac Procedures: - Group verbal and visual presentation and models provide information about basic cardiac anatomy and function. Reviews the testing methods done to diagnose heart disease and the outcomes of the test  results. Describes the treatment choices: Medical Management, Angioplasty, or Coronary Bypass Surgery for treating various heart conditions including Myocardial Infarction, Angina, Valve Disease, and Cardiac Arrhythmias.  Written material given at graduation. Flowsheet Row Cardiac Rehab from 03/27/2021 in [facility] Cardiac and Pulmonary Rehab  Date 03/20/21  Educator sb  Instruction Review Code 1- Verbalizes Understanding       Medication Safety: - Group verbal and visual instruction to review commonly prescribed medications for heart and lung disease. Reviews the medication, class of the drug, and side effects. Includes the steps to properly store meds and maintain the prescription regimen.  Written material given at graduation.   Intimacy: - Group verbal instruction through game format to discuss how heart and lung disease can affect sexual intimacy. Written material given at graduation.. Flowsheet Row Cardiac Rehab from 03/27/2021 in [facility] Cardiac and Pulmonary Rehab  Date 03/13/21  Educator AS  Instruction Review Code 1- Verbalizes Understanding       Know Your Numbers and Heart Failure: - Group verbal and visual instruction to discuss disease risk factors for cardiac and pulmonary disease and treatment options.  Reviews associated critical values for Overweight/Obesity, Hypertension, Cholesterol, and Diabetes.  Discusses basics of heart failure: signs/symptoms and treatments.  Introduces Heart Failure Zone chart for action plan for heart failure.  Written material given at graduation.   Infection Prevention: - Provides verbal and written material to individual with discussion of infection control including proper hand washing and proper equipment cleaning during exercise session. Flowsheet Row Cardiac Rehab from 03/27/2021 in [facility] Cardiac and Pulmonary Rehab  Education need identified 03/05/21  Date 03/05/21  Educator [place]  Instruction Review Code 1- Verbalizes Understanding        Falls Prevention: - Provides verbal and written material to individual with discussion of falls prevention and safety. Flowsheet Row Cardiac Rehab from 03/27/2021 in [facility] Cardiac and Pulmonary Rehab  Education need identified 03/05/21  Date 03/05/21  Educator [place]  Instruction Review Code 1- Verbalizes Understanding       Other: -Provides group and verbal instruction on various topics (see comments)   Knowledge Questionnaire Score:  Knowledge Questionnaire Score - 03/05/21 0918       Knowledge Questionnaire Score   Pre Score 22/26: Exercise, Smoking, Depression  Core Components/Risk Factors/Patient Goals at Admission:  Personal Goals and Risk Factors at Admission - 03/05/21 0937       Core Components/Risk Factors/Patient Goals on Admission    Weight Management Yes;Weight Loss    Intervention Weight Management: Develop a combined nutrition and exercise program designed to reach desired caloric intake, while maintaining appropriate intake of nutrient and fiber, sodium and fats, and appropriate energy expenditure required for the weight goal.;Weight Management: Provide education and appropriate resources to help participant work on and attain dietary goals.;Weight Management/Obesity: Establish reasonable short term and long term weight goals.    Admit Weight 179 lb (81.2 kg)    Goal Weight: Short Term 175 lb (79.4 kg)    Goal Weight: Long Term 170 lb (77.1 kg)    Expected Outcomes Short Term: Continue to assess and modify interventions until short term weight is achieved;Long Term: Adherence to nutrition and physical activity/exercise program aimed toward attainment of established weight goal;Weight Loss: Understanding of general recommendations for a balanced deficit meal plan, which promotes 1-2 lb weight loss per week and includes a negative energy balance of [phone] kcal/d;Understanding recommendations for meals to include 15-35% energy as protein, 25-35% energy  from fat, 35-60% energy from carbohydrates, less than 241m of dietary cholesterol, 20-35 gm of total fiber daily;Understanding of distribution of calorie intake throughout the day with the consumption of 4-5 meals/snacks    Lipids Yes    Intervention Provide education and support for participant on nutrition & aerobic/resistive exercise along with prescribed medications to achieve LDL <720m HDL >408m   Expected Outcomes Short Term: Participant states understanding of desired cholesterol values and is compliant with medications prescribed. Participant is following exercise prescription and nutrition guidelines.;Long Term: Cholesterol controlled with medications as prescribed, with individualized exercise RX and with personalized nutrition plan. Value goals: LDL < 61m59mDL > 40 mg.             Education:Diabetes - Individual verbal and written instruction to review signs/symptoms of diabetes, desired ranges of glucose level fasting, after meals and with exercise. Acknowledge that pre and post exercise glucose checks will be done for 3 sessions at entry of program.   Core Components/Risk Factors/Patient Goals Review:   Goals and Risk Factor Review     Row Name 04/01/21 1718             Core Components/Risk Factors/Patient Goals Review   Personal Goals Review Weight Management/Obesity;Hypertension       Review John Christensen to reach his weight goal of 170 pounds. Patients blood pressure has been doing well at rest and during exercise. Informed patient he could use a blood pressure cuff at home.       Expected Outcomes Short: maintain BP readings independently. Lose more weight. Long: get a blood pressure cuff to monitor pressure at home.                Core Components/Risk Factors/Patient Goals at Discharge (Final Review):   Goals and Risk Factor Review - 04/01/21 1718       Core Components/Risk Factors/Patient Goals Review   Personal Goals Review Weight  Management/Obesity;Hypertension    Review John Christensen to reach his weight goal of 170 pounds. Patients blood pressure has been doing well at rest and during exercise. Informed patient he could use a blood pressure cuff at home.    Expected Outcomes Short: maintain BP readings independently. Lose more weight. Long: get a blood pressure cuff to monitor pressure at home.  ITP Comments:  ITP Comments     Row Name 02/22/21 1517 03/05/21 0917 03/06/21 1710 03/20/21 0724 03/25/21 1736   ITP Comments Initial telephone orientation completed. Diagnosis can be found in [facility] 9/27. EP orientation scheduled for Tuesday 10/18 at 8am. Completed 6MWT and gym orientation. Initial ITP created and sent for review to Dr. Emily Filbert, Medical Director. First full day of exercise!  Patient was oriented to gym and equipment including functions, settings, policies, and procedures.  Patient's individual exercise prescription and treatment plan were reviewed.  All starting workloads were established based on the results of the 6 minute walk test done at initial orientation visit.  The plan for exercise progression was also introduced and progression will be customized based on patient's performance and goals. 30 Day review completed. Medical Director ITP review done, changes made as directed, and signed approval by Medical Director.   New to program Completed initial RD consultation    Row Name 04/17/21 0715           ITP Comments 30 Day review completed. Medical Director ITP review done, changes made as directed, and signed approval by Medical Director.                Comments:

## 2021-04-22 ENCOUNTER — Other Ambulatory Visit: Payer: Self-pay

## 2021-04-22 ENCOUNTER — Encounter: Payer: BC Managed Care – PPO | Attending: Cardiology | Admitting: *Deleted

## 2021-04-22 DIAGNOSIS — Z955 Presence of coronary angioplasty implant and graft: Secondary | ICD-10-CM | POA: Insufficient documentation

## 2021-04-22 NOTE — Progress Notes (Signed)
Daily Session Note  Patient Details  Name: John Christensen MRN: [id number] Date of Birth: 1970-12-30 Referring Provider:   Flowsheet Row Cardiac Rehab from 03/05/2021 in [facility] Cardiac and Pulmonary Rehab  Referring Provider End, Harrell Gave MD       Encounter Date: 04/22/2021  Check In:  Session Check In - 04/22/21 1710       Check-In   Supervising physician immediately available to respond to emergencies See telemetry face sheet for immediately available ER MD    Location [facility]-Cardiac & Pulmonary Rehab    Staff Present Renita Papa, RN Vickki Hearing, BA, ACSM CEP, Exercise Physiologist;Kara Eliezer Bottom, MS, ASCM CEP, Exercise Physiologist    Virtual Visit No    Medication changes reported     No    Fall or balance concerns reported    No    Warm-up and Cool-down Performed on first and last piece of equipment    Resistance Training Performed Yes    VAD Patient? No    PAD/SET Patient? No      Pain Assessment   Currently in Pain? No/denies                Social History   Tobacco Use  Smoking Status Never  Smokeless Tobacco Never    Goals Met:  Independence with exercise equipment Exercise tolerated well No report of concerns or symptoms today Strength training completed today  Goals Unmet:  Not Applicable  Comments: Pt able to follow exercise prescription today without complaint.  Will continue to monitor for progression.    Dr. Emily Filbert is Medical Director for [facility].  Dr. Ottie Glazier is Medical Director for [facility] Pulmonary Rehabilitation.

## 2021-04-24 ENCOUNTER — Other Ambulatory Visit: Payer: Self-pay

## 2021-04-24 DIAGNOSIS — Z955 Presence of coronary angioplasty implant and graft: Secondary | ICD-10-CM

## 2021-04-24 NOTE — Progress Notes (Signed)
Daily Session Note  Patient Details  Name: John Christensen MRN: [id number] Date of Birth: [birth year] Referring Provider:   Flowsheet Row Cardiac Rehab from 03/05/2021 in [facility] Cardiac and Pulmonary Rehab  Referring Provider End, Harrell Gave MD       Encounter Date: 04/24/2021  Check In:  Session Check In - 04/24/21 1648       Check-In   Supervising physician immediately available to respond to emergencies See telemetry face sheet for immediately available ER MD    Location [facility]-Cardiac & Pulmonary Rehab    Staff Present Birdie Sons, MPA, Nino Glow, MS, ASCM CEP, Exercise Physiologist;Melissa Caiola, RDN, LDN    Virtual Visit No    Medication changes reported     No    Fall or balance concerns reported    No    Tobacco Cessation No Change    Warm-up and Cool-down Performed on first and last piece of equipment    Resistance Training Performed Yes    VAD Patient? No    PAD/SET Patient? No      Pain Assessment   Currently in Pain? No/denies                Social History   Tobacco Use  Smoking Status Never  Smokeless Tobacco Never    Goals Met:  Independence with exercise equipment Exercise tolerated well No report of concerns or symptoms today Strength training completed today  Goals Unmet:  Not Applicable  Comments: Pt able to follow exercise prescription today without complaint.  Will continue to monitor for progression.    Dr. Emily Filbert is Medical Director for [facility].  Dr. Ottie Glazier is Medical Director for [facility] Pulmonary Rehabilitation.

## 2021-04-28 NOTE — Progress Notes (Signed)
Cardiology Office Note    Date:  04/29/2021   ID:  John Christensen, DOB 1970/09/01, MRN [id number]  PCP:  Smitty Cords, DO  Cardiologist:  Debbe Odea, MD  Electrophysiologist:  None   Chief Complaint: Follow up  History of Present Illness:   John Christensen is a 50 y.o. male with history of recently diagnosed CAD status post PCI to the LAD as outlined below and HLD who presents for follow up of CAD and HLD.   He was previously evaluated by Dr. Kirke Corin in 2015 for palpitations.  48 hour Holter was performed and showed sinus rhythm with sinus arrhythmia and rare PACs.  No significant arrhythmia was identified.  More recently, he established care with Dr. Azucena Cecil on 02/01/2021 with complaints of exertional chest pain.  Subsequent coronary CTA showed significant stenosis within the proximal LAD with an FFRct of 0.76.  Given this finding, he underwent LHC on 02/12/2021, which showed severe single-vessel CAD with 80 to 90% ostial/proximal LAD stenosis and 40 to 50% mid LAD stenosis.  There was no significant disease noted in the LCx or RCA.  Normal LV systolic function and filling pressure was noted.  He underwent successful IVUS guided PCI to the ostial/proximal LAD using an Onyx Frontier 3.0 x 15 mm drug-eluting stent with 0% residual stenosis and TIMI-3 flow.  Previously recommended outpatient echo was performed during admission which showed an EF of 55 to 60%, no regional wall motion abnormalities, normal LV diastolic function parameters, normal RV systolic function, no significant valvular abnormalities, and an estimated right atrial pressure of 3 mmHg.  He was seen in follow-up on 02/25/2021 and was doing well from a cardiac perspective.  No changes were indicated.  He comes in doing well from a cardiac perspective.  No chest pain, dyspnea, presyncope, or syncope.  He has noted intermittent episodes of palpitations described as a "rolling" sensation.  These episodes are  occurring on an almost daily, if not daily basis and are largely short-lived.  No associated symptoms.  He has noted a couple episodes of mild dizziness as well.  He is tolerating all cardiac medications without issues.  No falls, hematochezia, or melena.  He has not missed any doses of DAPT.  He has been working with cardiac rehab.   Labs independently reviewed: 01/2021 - Hgb 14.3, PLT 251, potassium 4.3, BUN 31, serum creatinine 1.06 12/2020 - A1c 4.7, TC 182, TG 69, HDL 55, LDL 111, albumin 4.3, AST/ALT normal 09/2017 - TSH normal  Past Medical History:  Diagnosis Date   Cardiac dysrhythmia, unspecified    Other malaise and fatigue    Unspecified congenital anomaly of heart     Past Surgical History:  Procedure Laterality Date   COLONOSCOPY WITH PROPOFOL N/A 01/30/2020   Procedure: COLONOSCOPY WITH PROPOFOL;  Surgeon: Toney Reil, MD;  Location: [facility] ENDOSCOPY;  Service: Gastroenterology;  Laterality: N/A;   CORONARY STENT INTERVENTION N/A 02/12/2021   Procedure: CORONARY STENT INTERVENTION;  Surgeon: Yvonne Kendall, MD;  Location: [facility] INVASIVE CV LAB;  Service: Cardiovascular;  Laterality: N/A;   LEFT HEART CATH AND CORONARY ANGIOGRAPHY Left 02/12/2021   Procedure: LEFT HEART CATH AND CORONARY ANGIOGRAPHY;  Surgeon: Yvonne Kendall, MD;  Location: [facility] INVASIVE CV LAB;  Service: Cardiovascular;  Laterality: Left;    Current Medications: Current Meds  Medication Sig   acetaminophen (TYLENOL) 500 MG tablet Take 500-1,000 mg by mouth every 6 (six) hours as needed for moderate pain.   albuterol (VENTOLIN HFA)  108 (90 Base) MCG/ACT inhaler Inhale 2 puffs into the lungs every 6 (six) hours as needed for wheezing or shortness of breath.   aspirin EC 81 MG tablet Take 1 tablet (81 mg total) by mouth daily. Swallow whole.   atorvastatin (LIPITOR) 40 MG tablet Take 1 tablet (40 mg total) by mouth daily.   clopidogrel (PLAVIX) 75 MG tablet Take 1 tablet (75 mg total) by mouth daily  with breakfast.   nitroGLYCERIN (NITROSTAT) 0.4 MG SL tablet Place 1 tablet (0.4 mg total) under the tongue every 5 (five) minutes as needed for chest pain. For a maximum of 3 doses    Allergies:   Patient has no known allergies.   Social History   Socioeconomic History   Marital status: Married    Spouse name: Not on file   Number of children: Not on file   Years of education: Not on file   Highest education level: Not on file  Occupational History   Not on file  Tobacco Use   Smoking status: Never   Smokeless tobacco: Never  Vaping Use   Vaping Use: Never used  Substance and Sexual Activity   Alcohol use: No   Drug use: No   Sexual activity: Not on file  Other Topics Concern   Not on file  Social History Narrative   Not on file   Social Determinants of Health   Financial Resource Strain: Not on file  Food Insecurity: Not on file  Transportation Needs: Not on file  Physical Activity: Not on file  Stress: Not on file  Social Connections: Not on file     Family History:  The patient's family history includes Hyperlipidemia in his father and mother. There is no history of Colon cancer or Prostate cancer.  ROS:   Review of Systems  Constitutional:  Negative for chills, diaphoresis, fever, malaise/fatigue and weight loss.  HENT:  Negative for congestion.   Eyes:  Negative for discharge and redness.  Respiratory:  Negative for cough, sputum production, shortness of breath and wheezing.   Cardiovascular:  Positive for palpitations. Negative for chest pain, orthopnea, claudication, leg swelling and PND.  Gastrointestinal:  Negative for abdominal pain, blood in stool, heartburn, melena, nausea and vomiting.  Musculoskeletal:  Negative for falls and myalgias.  Skin:  Negative for rash.  Neurological:  Positive for dizziness. Negative for tingling, tremors, sensory change, speech change, focal weakness, loss of consciousness and weakness.  Endo/Heme/Allergies:  Does not  bruise/bleed easily.  Psychiatric/Behavioral:  Negative for substance abuse. The patient is not nervous/anxious.   All other systems reviewed and are negative.   EKGs/Labs/Other Studies Reviewed:    Studies reviewed were summarized above. The additional studies were reviewed today:  Coronary CTA with FFR 02/07/2021: 1. Left Main:  No significant stenosis. 2. LAD: significant stenosis in the proximal LAD.  FFRct 0.76 3. LCX: No significant stenosis. 4. RCA: No significant stenosis.   IMPRESSION: 1.  CT FFR analysis showed significant stenosis in the proximal LAD. 2.  Recommend cardiac catheterization.     FINDINGS: Aorta:  Normal size.  No calcifications.  No dissection.   Aortic Valve:  Trileaflet.  No calcifications.   Coronary Arteries:  Normal coronary origin.  Right dominance.   RCA is a dominant artery that gives rise to PDA and PLA. There is no plaque.   Left main is a large artery that gives rise to LAD and LCX arteries. LM is free of disease   LAD has calcified  and noncalcified plaque in the proximal segment causing severe (>70%) stenosis.   LCX is a non-dominant artery that gives rise to two obtuse marginal branches. There is no plaque.   Other findings:   Normal pulmonary vein drainage into the left atrium.   Normal left atrial appendage without a thrombus.   Normal size of the pulmonary artery.   IMPRESSION: 1. Coronary calcium score of 118. This was 89th percentile for age and sex matched control.   2. Normal coronary origin with right dominance.   3. Calcified and non calcified plaque causing severe stenosis(>70%) in the proximal LAD.   4. CAD-RADS 4 Severe stenosis. (70-99% or > 50% left main). Cardiac catheterization is recommended. Additional analysis with CT FFR will be submitted and reported separately.   5. Consider symptom-guided anti-ischemic pharmacotherapy as well as risk factor modification per guideline directed care. __________    [facility] 02/12/2021: Conclusions: Severe single-vessel coronary artery disease with 80-90% ostial/proximal LAD stenosis (minimal luminal area 3.6 mm by IVUS) and 40-50% mid LAD stenosis (minimal luminal area 4.4 mm by IVUS).  No significant disease noted in the LCx and RCA. Normal left ventricular systolic function and filling pressure. Successful IVUS-guided PCI to the ostial/proximal LAD using Onyx frontier 3.0 x 15 mm drug-eluting stent (postdilated to 3.6 mm) with 0% residual stenosis and TIMI-3 flow.   Recommendations: Overnight extended recovery. Dual antiplatelet therapy with aspirin and clopidogrel for at least 6 months. Aggressive secondary prevention. __________   2D echo 02/13/2021: 1. Left ventricular ejection fraction, by estimation, is 55 to 60%. The  left ventricle has normal function. The left ventricle has no regional  wall motion abnormalities. Left ventricular diastolic parameters were  normal.   2. Right ventricular systolic function is normal. The right ventricular  size is not well visualized.   3. The mitral valve is normal in structure. No evidence of mitral valve  regurgitation.   4. The aortic valve is tricuspid. Aortic valve regurgitation is not  visualized.   5. The inferior vena cava is normal in size with greater than 50%  respiratory variability, suggesting right atrial pressure of 3 mmHg.   EKG:  EKG is ordered today.  The EKG ordered today demonstrates NSR, 69 bpm, no acute ST-T changes  Recent Labs: 12/31/2020: ALT 18 02/13/2021: BUN 31; Creatinine, Ser 1.06; Hemoglobin 14.3; Platelets 251; Potassium 4.3; Sodium 139  Recent Lipid Panel    Component Value Date/Time   CHOL 182 12/31/2020 0833   CHOL 174 10/18/2014 1544   TRIG 69 12/31/2020 0833   HDL 55 12/31/2020 0833   HDL 59 10/18/2014 1544   CHOLHDL 3.3 12/31/2020 0833   VLDL 9 09/09/2016 0922   LDLCALC 111 (H) 12/31/2020 0833    PHYSICAL EXAM:    VS:  BP 116/74 (BP Location: Left Arm,  Patient Position: Sitting, Cuff Size: Normal)   Pulse 69   Ht 5\' 8"  (1.727 m)   Wt 176 lb (79.8 kg)   SpO2 98%   BMI 26.76 kg/m   BMI: Body mass index is 26.76 kg/m.  Physical Exam Vitals reviewed.  Constitutional:      Appearance: He is well-developed.  HENT:     Head: Normocephalic and atraumatic.  Eyes:     General:        Right eye: No discharge.        Left eye: No discharge.  Neck:     Vascular: No JVD.  Cardiovascular:     Rate and Rhythm: Normal  rate and regular rhythm.     Pulses:          Posterior tibial pulses are 2+ on the right side and 2+ on the left side.     Heart sounds: Normal heart sounds, S1 normal and S2 normal. Heart sounds not distant. No midsystolic click and no opening snap. No murmur heard.   No friction rub.  Pulmonary:     Effort: Pulmonary effort is normal. No respiratory distress.     Breath sounds: Normal breath sounds. No decreased breath sounds, wheezing or rales.  Chest:     Chest wall: No tenderness.  Abdominal:     General: There is no distension.     Palpations: Abdomen is soft.     Tenderness: There is no abdominal tenderness.  Musculoskeletal:     Cervical back: Normal range of motion.     Right lower leg: No edema.     Left lower leg: No edema.  Skin:    General: Skin is warm and dry.     Nails: There is no clubbing.  Neurological:     Mental Status: He is alert and oriented to person, place, and time.  Psychiatric:        Speech: Speech normal.        Behavior: Behavior normal.        Thought Content: Thought content normal.        Judgment: Judgment normal.    Wt Readings from Last 3 Encounters:  04/29/21 176 lb (79.8 kg)  03/05/21 179 lb 11.2 oz (81.5 kg)  02/25/21 180 lb (81.6 kg)     ASSESSMENT & PLAN:   CAD involving the native coronary arteries without angina: He is doing well without symptoms concerning for angina.  Continue risk factor modification and secondary prevention with aspirin and clopidogrel  without interruption for at least 6 months dating back to date of PCI (02/12/2021) along with atorvastatin and as needed SL NTG.  He continues to participate with cardiac rehab without cardiac limitation.  No indication for further ischemic testing at this time.  HLD: LDL 111 in 12/2020 with goal being less than 70.  He remains on atorvastatin 40 mg daily.  Check LFT, lipid panel, and direct LDL with recommendation to escalate lipid therapy to achieve target LDL.  Palpitations: Symptoms sound similar to possible PAC versus PVC.  Place Zio patch x1 week.   Disposition: F/u with Dr. Azucena Cecil or an APP in 8 weeks.   Medication Adjustments/Labs and Tests Ordered: Current medicines are reviewed at length with the patient today.  Concerns regarding medicines are outlined above. Medication changes, Labs and Tests ordered today are summarized above and listed in the Patient Instructions accessible in Encounters.   Signed, Eula Listen, PA-C 04/29/2021 3:43 PM     [facility] [place] [address] [place], [place] [address] [phone]

## 2021-04-29 ENCOUNTER — Other Ambulatory Visit: Payer: Self-pay

## 2021-04-29 ENCOUNTER — Ambulatory Visit (INDEPENDENT_AMBULATORY_CARE_PROVIDER_SITE_OTHER): Payer: BC Managed Care – PPO | Admitting: Physician Assistant

## 2021-04-29 ENCOUNTER — Encounter: Payer: Self-pay | Admitting: Physician Assistant

## 2021-04-29 ENCOUNTER — Ambulatory Visit (INDEPENDENT_AMBULATORY_CARE_PROVIDER_SITE_OTHER): Payer: BC Managed Care – PPO

## 2021-04-29 VITALS — BP 116/74 | HR 69 | Ht 68.0 in | Wt 176.0 lb

## 2021-04-29 DIAGNOSIS — R002 Palpitations: Secondary | ICD-10-CM

## 2021-04-29 DIAGNOSIS — E785 Hyperlipidemia, unspecified: Secondary | ICD-10-CM | POA: Diagnosis not present

## 2021-04-29 DIAGNOSIS — Z955 Presence of coronary angioplasty implant and graft: Secondary | ICD-10-CM

## 2021-04-29 DIAGNOSIS — R0609 Other forms of dyspnea: Secondary | ICD-10-CM | POA: Diagnosis not present

## 2021-04-29 DIAGNOSIS — I2510 Atherosclerotic heart disease of native coronary artery without angina pectoris: Secondary | ICD-10-CM | POA: Diagnosis not present

## 2021-04-29 DIAGNOSIS — R072 Precordial pain: Secondary | ICD-10-CM | POA: Diagnosis not present

## 2021-04-29 NOTE — Progress Notes (Signed)
Daily Session Note  Patient Details  Name: John Christensen MRN: [id number] Date of Birth: 06-25-1970 Referring Provider:   Flowsheet Row Cardiac Rehab from 03/05/2021 in [facility] Cardiac and Pulmonary Rehab  Referring Provider End, Harrell Gave MD       Encounter Date: 04/29/2021  Check In:  Session Check In - 04/29/21 1532       Check-In   Supervising physician immediately available to respond to emergencies See telemetry face sheet for immediately available ER MD    Location [facility]-Cardiac & Pulmonary Rehab    Staff Present Birdie Sons, MPA, Nino Glow, MS, ASCM CEP, Exercise Physiologist;Joseph Tessie Fass, [place]    Virtual Visit No    Medication changes reported     No    Fall or balance concerns reported    No    Tobacco Cessation No Change    Warm-up and Cool-down Performed on first and last piece of equipment    Resistance Training Performed Yes    VAD Patient? No    PAD/SET Patient? No      Pain Assessment   Currently in Pain? No/denies                Social History   Tobacco Use  Smoking Status Never  Smokeless Tobacco Never    Goals Met:  Independence with exercise equipment Exercise tolerated well No report of concerns or symptoms today Strength training completed today  Goals Unmet:  Not Applicable  Comments: Pt able to follow exercise prescription today without complaint.  Will continue to monitor for progression.    Dr. Emily Filbert is Medical Director for [place].  Dr. Ottie Glazier is Medical Director for [facility] Pulmonary Rehabilitation.

## 2021-04-29 NOTE — Patient Instructions (Addendum)
Medication Instructions:  No changes at this time.   *If you need a refill on your cardiac medications before your next appointment, please call your pharmacy*   Lab Work: Lipid, liver, and direct LDL today  If you have labs (blood work) drawn today and your tests are completely normal, you will receive your results only by: MyChart Message (if you have MyChart) OR A paper copy in the mail If you have any lab test that is abnormal or we need to change your treatment, we will call you to review the results.   Testing/Procedures: Your provider has ordered a heart monitor to wear for 7 days. This will be mailed to your home with instructions on placement. Once you have finished the time frame requested, you will return monitor in box provided.      Follow-Up: At [facility], you and your health needs are our priority.  As part of our continuing mission to provide you with exceptional heart care, we have created designated Provider Care Teams.  These Care Teams include your primary Cardiologist (physician) and Advanced Practice Providers (APPs -  Physician Assistants and Nurse Practitioners) who all work together to provide you with the care you need, when you need it.   Your next appointment:   2 month(s)  The format for your next appointment:   In Person  Provider:   Debbe Odea, MD or Eula Listen, PA-C

## 2021-04-30 ENCOUNTER — Telehealth: Payer: Self-pay | Admitting: *Deleted

## 2021-04-30 LAB — HEPATIC FUNCTION PANEL
ALT: 26 IU/L (ref 0–44)
AST: 20 IU/L (ref 0–40)
Albumin: 4.6 g/dL (ref 3.8–4.9)
Alkaline Phosphatase: 66 IU/L (ref 44–121)
Bilirubin Total: 0.7 mg/dL (ref 0.0–1.2)
Bilirubin, Direct: 0.22 mg/dL (ref 0.00–0.40)
Total Protein: 7.3 g/dL (ref 6.0–8.5)

## 2021-04-30 LAB — LIPID PANEL
Chol/HDL Ratio: 2.4 ratio (ref 0.0–5.0)
Cholesterol, Total: 134 mg/dL (ref 100–199)
HDL: 55 mg/dL (ref >39)
LDL Chol Calc (NIH): 67 mg/dL (ref 0–99)
Triglycerides: 56 mg/dL (ref 0–149)
VLDL Cholesterol Cal: 12 mg/dL (ref 5–40)

## 2021-04-30 LAB — LDL CHOLESTEROL, DIRECT: LDL Direct: 65 mg/dL (ref 0–99)

## 2021-04-30 NOTE — Telephone Encounter (Signed)
-----   Message from Sondra Barges, PA-C sent at 04/30/2021  7:10 AM EST ----- Liver function normal LDL improved and at goal No changes from plan discussed at office visit, continue current medications

## 2021-04-30 NOTE — Telephone Encounter (Signed)
Left detailed voicemail message per release form with results, recommendations, and to call back if any further questions.

## 2021-05-01 ENCOUNTER — Other Ambulatory Visit: Payer: Self-pay

## 2021-05-01 DIAGNOSIS — R002 Palpitations: Secondary | ICD-10-CM

## 2021-05-01 DIAGNOSIS — Z955 Presence of coronary angioplasty implant and graft: Secondary | ICD-10-CM

## 2021-05-01 NOTE — Progress Notes (Signed)
Daily Session Note  Patient Details  Name: John Christensen MRN: [id number] Date of Birth: [birth year] Referring Provider:   Flowsheet Row Cardiac Rehab from 03/05/2021 in [facility] Cardiac and Pulmonary Rehab  Referring Provider End, Harrell Gave MD       Encounter Date: 05/01/2021  Check In:  Session Check In - 05/01/21 1646       Check-In   Supervising physician immediately available to respond to emergencies See telemetry face sheet for immediately available ER MD    Location [facility]Card[facility] Pul[facility] Rehab    Staff Present Birdie Sons, MPA, Nino Glow, MS, ASCM CEP, Exercise Physiologist;Meredith Sherryll Burger, RN BSN    Virtual Visit No    Medication changes reported     No    Fall or balance concerns reported    No    Tobacco Cessation No Change    Warm-up and Cool-down Performed on first and last piece of equipment    Resistance Training Performed Yes    VAD Patient? No    PAD/SET Patient? No      Pain Assessment   Currently in Pain? No/denies                Social History   Tobacco Use  Smoking Status Never  Smokeless Tobacco Never    Goals Met:  Independence with exercise equipment Exercise tolerated well No report of concerns or symptoms today Strength training completed today  Goals Unmet:  Not Applicable  Comments: Pt able to follow exercise prescription today without complaint.  Will continue to monitor for progression.    Dr. Emily Filbert is Medical Director for [place].  Dr. Ottie Glazier is Medical Director for [facility] Pulmonary Rehabilitation.

## 2021-05-02 ENCOUNTER — Other Ambulatory Visit: Payer: Self-pay | Admitting: *Deleted

## 2021-05-02 MED ORDER — NITROGLYCERIN 0.4 MG SL SUBL: 0.4000 mg | SUBLINGUAL_TABLET | SUBLINGUAL | 1 refills | Status: DC | PRN

## 2021-05-06 ENCOUNTER — Other Ambulatory Visit: Payer: Self-pay

## 2021-05-06 ENCOUNTER — Encounter: Payer: BC Managed Care – PPO | Admitting: *Deleted

## 2021-05-06 DIAGNOSIS — Z955 Presence of coronary angioplasty implant and graft: Secondary | ICD-10-CM

## 2021-05-06 NOTE — Progress Notes (Signed)
Daily Session Note  Patient Details  Name: STEPHONE GUM MRN: [id number] Date of Birth: [birth year] Referring Provider:   Flowsheet Row Cardiac Rehab from 03/05/2021 in [facility] Cardiac and Pulmonary Rehab  Referring Provider End, Harrell Gave MD       Encounter Date: 05/06/2021  Check In:  Session Check In - 05/06/21 1713       Check-In   Supervising physician immediately available to respond to emergencies See telemetry face sheet for immediately available ER MD    Location [facility]-Card[facility] Pulmonary Rehab    Staff Present Renita Papa, RN BSN;Joseph Dawson, RCP,RRT,BSRT;Kara [place], [place], ASCM CEP, Exercise Physiologist    Virtual Visit No    Medication changes reported     No    Fall or balance concerns reported    No    Warm-up and Cool-down Performed on first and last piece of equipment    Resistance Training Performed Yes    VAD Patient? No    PAD/SET Patient? No      Pain Assessment   Currently in Pain? No/denies                Social History   Tobacco Use  Smoking Status Never  Smokeless Tobacco Never    Goals Met:  Proper associated with RPD/PD & O2 Sat Exercise tolerated well No report of concerns or symptoms today Strength training completed today  Goals Unmet:  Not Applicable  Comments: Pt able to follow exercise prescription today without complaint.  Will continue to monitor for progression.    Dr. Emily Filbert is Medical Director for [place].  Dr. Ottie Glazier is Medical Director for [facility] Pulmonary Rehabilitation.

## 2021-05-08 ENCOUNTER — Other Ambulatory Visit: Payer: Self-pay

## 2021-05-08 ENCOUNTER — Encounter: Payer: BC Managed Care – PPO | Admitting: *Deleted

## 2021-05-08 DIAGNOSIS — Z955 Presence of coronary angioplasty implant and graft: Secondary | ICD-10-CM | POA: Diagnosis not present

## 2021-05-08 NOTE — Progress Notes (Signed)
Daily Session Note  Patient Details  Name: John Christensen MRN: [id number] Date of Birth: Oct 03, 1970 Referring Provider:   Flowsheet Row Cardiac Rehab from 03/05/2021 in [facility] Cardiac and Pulmonary Rehab  Referring Provider End, Harrell Gave MD       Encounter Date: 05/08/2021  Check In:  Session Check In - 05/08/21 1643       Check-In   Supervising physician immediately available to respond to emergencies See telemetry face sheet for immediately available ER MD    Location [facility]-Cardiac & Pulmonary Rehab    Staff Present Renita Papa, RN BSN;Joseph [place], RCP,RRT,BSRT;Kara [place], [place], ASCM CEP, Exercise Physiologist    Virtual Visit No    Medication changes reported     No    Fall or balance concerns reported    No    Warm-up and Cool-down Performed on first and last piece of equipment    Resistance Training Performed Yes    VAD Patient? No    PAD/SET Patient? No      Pain Assessment   Currently in Pain? No/denies                Social History   Tobacco Use  Smoking Status Never  Smokeless Tobacco Never    Goals Met:  Independence with exercise equipment Exercise tolerated well No report of concerns or symptoms today Strength training completed today  Goals Unmet:  Not Applicable  Comments: Pt able to follow exercise prescription today without complaint.  Will continue to monitor for progression.    Dr. Emily Filbert is Medical Director for [place].  Dr. Ottie Glazier is Medical Director for [facility] Pulmonary Rehabilitation.

## 2021-05-15 ENCOUNTER — Encounter: Payer: Self-pay | Admitting: *Deleted

## 2021-05-15 ENCOUNTER — Other Ambulatory Visit: Payer: Self-pay

## 2021-05-15 ENCOUNTER — Encounter: Payer: BC Managed Care – PPO | Admitting: *Deleted

## 2021-05-15 DIAGNOSIS — Z955 Presence of coronary angioplasty implant and graft: Secondary | ICD-10-CM | POA: Diagnosis not present

## 2021-05-15 DIAGNOSIS — R002 Palpitations: Secondary | ICD-10-CM | POA: Diagnosis not present

## 2021-05-15 NOTE — Progress Notes (Signed)
Cardiac Individual Treatment Plan  Patient Details  Name: JAAZIEL PEATROSS MRN: [id number] Date of Birth: 1971/04/06 Referring Provider:   Flowsheet Row Cardiac Rehab from 03/05/2021 in [facility] Cardiac and Pulmonary Rehab  Referring Provider End, Harrell Gave MD       Initial Encounter Date:  Flowsheet Row Cardiac Rehab from 03/05/2021 in [facility] Cardiac and Pulmonary Rehab  Date 03/05/21       Visit Diagnosis: Status post coronary artery stent placement  Patient's Home Medications on Admission:  Current Outpatient Medications:    acetaminophen (TYLENOL) 500 MG tablet, Take 500-1,000 mg by mouth every 6 (six) hours as needed for moderate pain., Disp: , Rfl:    albuterol (VENTOLIN HFA) 108 (90 Base) MCG/ACT inhaler, Inhale 2 puffs into the lungs every 6 (six) hours as needed for wheezing or shortness of breath., Disp: 8 g, Rfl: 2   aspirin EC 81 MG tablet, Take 1 tablet (81 mg total) by mouth daily. Swallow whole., Disp: 90 tablet, Rfl: 3   atorvastatin (LIPITOR) 40 MG tablet, Take 1 tablet (40 mg total) by mouth daily., Disp: 30 tablet, Rfl: 5   clopidogrel (PLAVIX) 75 MG tablet, Take 1 tablet (75 mg total) by mouth daily with breakfast., Disp: 90 tablet, Rfl: 3   nitroGLYCERIN (NITROSTAT) 0.4 MG SL tablet, Place 1 tablet (0.4 mg total) under the tongue every 5 (five) minutes as needed for chest pain. For a maximum of 3 doses, Disp: 25 tablet, Rfl: 1  Past Medical History: Past Medical History:  Diagnosis Date   Cardiac dysrhythmia, unspecified    Other malaise and fatigue    Unspecified congenital anomaly of heart     Tobacco Use: Social History   Tobacco Use  Smoking Status Never  Smokeless Tobacco Never    Labs: Recent Review Flowsheet Data     Labs for ITP Cardiac and Pulmonary Rehab Latest Ref Rng & Units 09/09/2016 09/25/2017 12/28/2019 12/31/2020 04/29/2021   Cholestrol 100 - 199 mg/dL 157 165 184 182 134   LDLCALC 0 - 99 mg/dL 85 98 110(H) 111(H) 67   LDLDIRECT 0 - 99  mg/dL - - - - 65   HDL >39 mg/dL 63 54 59 55 55   Trlycerides 0 - 149 mg/dL 47 51 65 69 56   Hemoglobin A1c <5.7 % of total Hgb - 4.7 4.5 4.7 -        Exercise Target Goals: Exercise Program Goal: Individual exercise prescription set using results from initial 6 min walk test and THRR while considering  patients activity barriers and safety.   Exercise Prescription Goal: Initial exercise prescription builds to 30-45 minutes a day of aerobic activity, 2-3 days per week.  Home exercise guidelines will be given to patient during program as part of exercise prescription that the participant will acknowledge.   Education: Aerobic Exercise: - Group verbal and visual presentation on the components of exercise prescription. Introduces F.I.T.T principle from ACSM for exercise prescriptions.  Reviews F.I.T.T. principles of aerobic exercise including progression. Written material given at graduation. Flowsheet Row Cardiac Rehab from 05/08/2021 in [facility] Cardiac and Pulmonary Rehab  Education need identified 03/05/21  Date 03/13/21  Educator AS       Education: Resistance Exercise: - Group verbal and visual presentation on the components of exercise prescription. Introduces F.I.T.T principle from ACSM for exercise prescriptions  Reviews F.I.T.T. principles of resistance exercise including progression. Written material given at graduation. Flowsheet Row Cardiac Rehab from 05/08/2021 in [facility] Cardiac and Pulmonary Rehab  Date  03/20/21  Educator as  Instruction Review Code 1- Science writer Understanding        Education: Exercise & Equipment Safety: - Individual verbal instruction and demonstration of equipment use and safety with use of the equipment. Flowsheet Row Cardiac Rehab from 05/08/2021 in [facility] Cardiac and Pulmonary Rehab  Education need identified 03/05/21  Date 03/05/21  Educator [place]  Instruction Review Code 1- Verbalizes Understanding       Education: Exercise Physiology &  General Exercise Guidelines: - Group verbal and written instruction with models to review the exercise physiology of the cardiovascular system and associated critical values. Provides general exercise guidelines with specific guidelines to those with heart or lung disease.  Flowsheet Row Cardiac Rehab from 05/08/2021 in [facility] Cardiac and Pulmonary Rehab  Education need identified 03/05/21  Date 05/08/21  Educator AS  Instruction Review Code 1- Verbalizes Understanding       Education: Flexibility, Balance, Mind/Body Relaxation: - Group verbal and visual presentation with interactive activity on the components of exercise prescription. Introduces F.I.T.T principle from ACSM for exercise prescriptions. Reviews F.I.T.T. principles of flexibility and balance exercise training including progression. Also discusses the mind body connection.  Reviews various relaxation techniques to help reduce and manage stress (i.e. Deep breathing, progressive muscle relaxation, and visualization). Balance handout provided to take home. Written material given at graduation. Flowsheet Row Cardiac Rehab from 05/08/2021 in [facility] Cardiac and Pulmonary Rehab  Date 03/27/21  Educator AS  Instruction Review Code 1- Verbalizes Understanding       Activity Barriers & Risk Stratification:  Activity Barriers & Cardiac Risk Stratification - 03/05/21 0920       Activity Barriers & Cardiac Risk Stratification   Activity Barriers None    Cardiac Risk Stratification Moderate             6 Minute Walk:  6 Minute Walk     Row Name 03/05/21 0920         6 Minute Walk   Phase Initial     Distance 1265 feet     Walk Time 6 minutes     # of Rest Breaks 0     MPH 2.39     METS 3.77     RPE 7     Perceived Dyspnea  0     VO2 Peak 13.2     Symptoms No     Resting HR 72 bpm     Resting BP 112/58     Resting Oxygen Saturation  98 %     Exercise Oxygen Saturation  during 6 min walk 100 %     Max Ex. HR 88 bpm      Max Ex. BP 124/72     2 Minute Post BP 108/76              Oxygen Initial Assessment:   Oxygen Re-Evaluation:   Oxygen Discharge (Final Oxygen Re-Evaluation):   Initial Exercise Prescription:  Initial Exercise Prescription - 03/05/21 0900       Date of Initial Exercise RX and Referring Provider   Date 03/05/21    Referring Provider End, Harrell Gave MD      Treadmill   MPH 2.5    Grade 1.5    Minutes 15    METs 3.43      NuStep   Level 3    SPM 80    Minutes 15    METs 3.7      REL-XR   Level 3    Speed  50    Minutes 15    METs 3.7      Prescription Details   Frequency (times per week) 2    Duration Progress to 30 minutes of continuous aerobic without signs/symptoms of physical distress      Intensity   THRR 40-80% of Max Heartrate 110-149    Ratings of Perceived Exertion 11-13    Perceived Dyspnea 0-4      Progression   Progression Continue to progress workloads to maintain intensity without signs/symptoms of physical distress.      Resistance Training   Training Prescription Yes    Weight 4 lb    Reps 10-15             Perform Capillary Blood Glucose checks as needed.  Exercise Prescription Changes:   Exercise Prescription Changes     Row Name 03/05/21 0900 03/13/21 1300 03/25/21 0900 03/25/21 1800 04/10/21 1600     Response to Exercise   Blood Pressure (Admit) 112/78 102/64 112/60 -- 110/68   Blood Pressure (Exercise) 124/72 142/70 126/60 -- --   Blood Pressure (Exit) 108/76 112/68 124/72 -- 112/64   Heart Rate (Admit) 72 bpm 91 bpm 87 bpm -- 75 bpm   Heart Rate (Exercise) 88 bpm 150 bpm 143 bpm -- 151 bpm   Heart Rate (Exit) 68 bpm 84 bpm 98 bpm -- 90 bpm   Oxygen Saturation (Admit) 98 % -- -- -- --   Oxygen Saturation (Exercise) 100 % -- -- -- --   Oxygen Saturation (Exit) 98 % -- -- -- --   Rating of Perceived Exertion (Exercise) _0 -- 13   Perceived Dyspnea (Exercise) 0 -- -- -- --   Symptoms none none none -- none    Comments walk test results second day -- -- --   Duration -- Continue with 30 min of aerobic exercise without signs/symptoms of physical distress. Continue with 30 min of aerobic exercise without signs/symptoms of physical distress. -- Continue with 30 min of aerobic exercise without signs/symptoms of physical distress.   Intensity -- THRR unchanged THRR unchanged -- THRR unchanged     Progression   Progression -- Continue to progress workloads to maintain intensity without signs/symptoms of physical distress. Continue to progress workloads to maintain intensity without signs/symptoms of physical distress. -- Continue to progress workloads to maintain intensity without signs/symptoms of physical distress.   Average METs -- 5.21 4.73 -- 5.6     Resistance Training   Training Prescription -- Yes Yes -- Yes   Weight -- 4 lb 6 lb -- 6 lb   Reps -- 10-15 10-15 -- 10-15     Interval Training   Interval Training -- -- No -- No     Treadmill   MPH -- 3 3.3 -- 3.5   Grade -- 1.5 1.5 -- 4   Minutes -- 15 15 -- 15   METs -- 3.92 4.21 -- 5.61     NuStep   Level -- 7 7 -- --   Minutes -- 15 15 -- --   METs -- 6.5 6.3 -- --     REL-XR   Level -- -- 7 -- --   Minutes -- -- 15 -- --     Home Exercise Plan   Plans to continue exercise at -- -- -- [employer] (comment)  [employer] (comment)  [employer]   Frequency -- -- -- Add 2 additional days to program exercise sessions.  start with  1 Add 2 additional days to program exercise sessions.  start with 1   Initial Home Exercises Provided -- -- -- 03/25/21 03/25/21    Row Name 04/22/21 1400 05/07/21 1500           Response to Exercise   Blood Pressure (Admit) 110/68 106/64      Blood Pressure (Exit) 106/64 98/56      Heart Rate (Admit) 82 bpm 75 bpm      Heart Rate (Exercise) 149 bpm 131 bpm      Heart Rate (Exit) 95 bpm 110 bpm      Rating of Perceived Exertion (Exercise) 12 13      Symptoms none none       Duration Continue with 30 min of aerobic exercise without signs/symptoms of physical distress. Continue with 30 min of aerobic exercise without signs/symptoms of physical distress.      Intensity THRR unchanged THRR unchanged        Progression   Progression Continue to progress workloads to maintain intensity without signs/symptoms of physical distress. Continue to progress workloads to maintain intensity without signs/symptoms of physical distress.      Average METs 4.12 5.62        Resistance Training   Training Prescription Yes Yes      Weight 7 lb 8 lb      Reps 10-15 10-15        Interval Training   Interval Training No No        Treadmill   MPH 3.6 3.5      Grade 4 4.5      Minutes 15 15      METs 5.74 5.86        NuStep   Level -- 8      Minutes -- 15      METs -- 7.1        Elliptical   Level 2 --      Speed 4.7 --      Minutes 15 --        REL-XR   Level 6 10      Minutes 15 15      METs 2.5 4.6        Home Exercise Plan   Plans to continue exercise at [employer] (comment)  [place] (comment)  [employer]      Frequency Add 2 additional days to program exercise sessions.  start with 1 Add 2 additional days to program exercise sessions.  start with 1      Initial Home Exercises Provided 03/25/21 03/25/21        Oxygen   Maintain Oxygen Saturation 88% or higher 88% or higher               Exercise Comments:   Exercise Goals and Review:   Exercise Goals     Row Name 03/05/21 0937             Exercise Goals   Increase Physical Activity Yes       Intervention Provide advice, education, support and counseling about physical activity/exercise needs.;Develop an individualized exercise prescription for aerobic and resistive training based on initial evaluation findings, risk stratification, comorbidities and participant's personal goals.       Expected Outcomes Short Term: Attend rehab on a regular basis to  increase amount of physical activity.;Long Term: Add in home exercise to make exercise part of routine and to increase amount of physical activity.;Long Term: Exercising  regularly at least 3-5 days a week.       Increase Strength and Stamina Yes       Intervention Provide advice, education, support and counseling about physical activity/exercise needs.;Develop an individualized exercise prescription for aerobic and resistive training based on initial evaluation findings, risk stratification, comorbidities and participant's personal goals.       Expected Outcomes Short Term: Increase workloads from initial exercise prescription for resistance, speed, and METs.;Short Term: Perform resistance training exercises routinely during rehab and add in resistance training at home;Long Term: Improve cardiorespiratory fitness, muscular endurance and strength as measured by increased METs and functional capacity (6MWT)       Able to understand and use rate of perceived exertion (RPE) scale Yes       Intervention Provide education and explanation on how to use RPE scale       Expected Outcomes Short Term: Able to use RPE daily in rehab to express subjective intensity level;Long Term:  Able to use RPE to guide intensity level when exercising independently       Able to understand and use Dyspnea scale Yes       Intervention Provide education and explanation on how to use Dyspnea scale       Expected Outcomes Short Term: Able to use Dyspnea scale daily in rehab to express subjective sense of shortness of breath during exertion;Long Term: Able to use Dyspnea scale to guide intensity level when exercising independently       Knowledge and understanding of Target Heart Rate Range (THRR) Yes       Intervention Provide education and explanation of THRR including how the numbers were predicted and where they are located for reference       Expected Outcomes Short Term: Able to state/look up THRR;Long Term: Able to use THRR to  govern intensity when exercising independently;Short Term: Able to use daily as guideline for intensity in rehab       Able to check pulse independently Yes       Intervention Provide education and demonstration on how to check pulse in carotid and radial arteries.;Review the importance of being able to check your own pulse for safety during independent exercise       Expected Outcomes Short Term: Able to explain why pulse checking is important during independent exercise;Long Term: Able to check pulse independently and accurately       Understanding of Exercise Prescription Yes       Intervention Provide education, explanation, and written materials on patient's individual exercise prescription       Expected Outcomes Short Term: Able to explain program exercise prescription;Long Term: Able to explain home exercise prescription to exercise independently                Exercise Goals Re-Evaluation :  Exercise Goals Re-Evaluation     Row Name 03/06/21 1713 03/13/21 1353 03/25/21 0940 03/25/21 1802 04/10/21 1603     Exercise Goal Re-Evaluation   Exercise Goals Review Increase Physical Activity;Understanding of Exercise Prescription;Knowledge and understanding of Target Heart Rate Range (THRR);Able to understand and use rate of perceived exertion (RPE) scale;Increase Strength and Stamina;Able to check pulse independently Increase Physical Activity;Increase Strength and Stamina Increase Physical Activity;Increase Strength and Stamina Increase Physical Activity;Increase Strength and Stamina;Understanding of Exercise Prescription Increase Physical Activity;Increase Strength and Stamina   Comments Reviewed RPE and dyspnea scales, THR and program prescription with pt today.  Pt voiced understanding and was given a copy of goals to take  home. Christia has tolerated exercise well in his first two sessions.  He works in [employer] range.  Staff will monitor. Glyn is doing well his first couple of weeks being here.  He is consistently hitting his THR each time. He has already increased his speed on the treadmill to 3.3 and his handweights to 6 lbs. Will continue to monitor. Reviewed home exercise with pt today.  Pt plans to go back to [employer] for exercise.  He plans to do cardio machines as well as weights. Reviewed THR, pulse, RPE, sign and symptoms, pulse oximetery and when to call 911 or MD.  Also discussed weather considerations and indoor options.  Pt voiced understanding. Treon is progressing well and has increased to 3.5 mph and 4 % grade on TM.  He uses 7 lb for strength training.   Expected Outcomes Short: Use RPE daily to regulate intensity. Long: Follow program prescription in THR. Short: attend consistently Long: improve overall stamina Short: Continue to build up loads on treadmill Long: Continue to increase overall MET level Short: Attend [employer] again and add 1 day of exercise at home Long: Exercise independently at home at appropriate prescription Short: continue to exercise consistently Long: improve average MET level    Row Name 04/22/21 1422 04/22/21 1722 05/07/21 1545         Exercise Goal Re-Evaluation   Exercise Goals Review Increase Physical Activity;Increase Strength and Stamina;Understanding of Exercise Prescription Increase Physical Activity;Increase Strength and Stamina;Understanding of Exercise Prescription Increase Physical Activity;Increase Strength and Stamina     Comments Kendra is doing well in rehab.  He is up 8 lb weights and has tried level 9 on the XR.  He is also using the elliptical.  We will continue to monitor his progress. Gleb has not joined [employer] yet but plans to join after Christmas. He walks his dog on the weekends (therefore totals 4 days of exercise/ week) and attempts to jog a little bit, which he is going to talk to his doctor about.   He tolerates it well. We stressed about checking his HR to make sure it stays in stable range and plans to get an  exercise or smart watch for Christmas. Gunnard continues to do well in rehab. He worked up to 7.1 METS on the T4 Nustep! He also increased all the way to level 10 for the XR. Will continue to monitor.     Expected Outcomes Short: Work back up on XR as only at 2.5 METs  Long: Continue to improve stamina. Short: Check HR and talk to doctor about running at home Long: Exercise indendently at home at appropriate prescription. Short: build up more incline on treadmill Long: Continue to increase overall MET level              Discharge Exercise Prescription (Final Exercise Prescription Changes):  Exercise Prescription Changes - 05/07/21 1500       Response to Exercise   Blood Pressure (Admit) 106/64    Blood Pressure (Exit) 98/56    Heart Rate (Admit) 75 bpm    Heart Rate (Exercise) 131 bpm    Heart Rate (Exit) 110 bpm    Rating of Perceived Exertion (Exercise) 13    Symptoms none    Duration Continue with 30 min of aerobic exercise without signs/symptoms of physical distress.    Intensity THRR unchanged      Progression   Progression Continue to progress workloads to maintain intensity without signs/symptoms of physical distress.  Average METs 5.62      Resistance Training   Training Prescription Yes    Weight 8 lb    Reps 10-15      Interval Training   Interval Training No      Treadmill   MPH 3.5    Grade 4.5    Minutes 15    METs 5.86      NuStep   Level 8    Minutes 15    METs 7.1      REL-XR   Level 10    Minutes 15    METs 4.6      Home Exercise Plan   Plans to continue exercise at [employer] (comment)   [employer]   Frequency Add 2 additional days to program exercise sessions.   start with 1   Initial Home Exercises Provided 03/25/21      Oxygen   Maintain Oxygen Saturation 88% or higher             Nutrition:  Target Goals: Understanding of nutrition guidelines, daily intake of sodium <1526m, cholesterol <203m calories 30% from fat  and 7% or less from saturated fats, daily to have 5 or more servings of fruits and vegetables.  Education: All About Nutrition: -Group instruction provided by verbal, written material, interactive activities, discussions, models, and posters to present general guidelines for heart healthy nutrition including fat, fiber, MyPlate, the role of sodium in heart healthy nutrition, utilization of the nutrition label, and utilization of this knowledge for meal planning. Follow up email sent as well. Written material given at graduation.   Biometrics:  Pre Biometrics - 03/05/21 0919       Pre Biometrics   Height 5' 8.5" (1.74 m)    Weight 179 lb 11.2 oz (81.5 kg)    BMI (Calculated) 26.92    Single Leg Stand 30 seconds              Nutrition Therapy Plan and Nutrition Goals:  Nutrition Therapy & Goals - 03/25/21 1736       Nutrition Therapy   Diet Heart healthy, low Na    Drug/Food Interactions Statins/Certain Fruits    Protein (specify units) 65g    Fiber 30 grams    Whole Grain Foods 3 servings    Saturated Fats 12 max. grams    Fruits and Vegetables 8 servings/day    Sodium 1.5 grams      Personal Nutrition Goals   Nutrition Goal ST: swap white bread for whole wheat bread at breakfast, try out making own breakfast sandwiches LT: limit saturated fat <12g/day, eat at least 5 fruits/vegetable servings per day, improve vegetable variety, limit processed meat < 3x/week    Comments Patient is [age].o. M admitted to rehab for stent placement. PMH of CAD. Relevent medications: lipitor. PYP: 57:  [employer] that in the morning he has a jimmy dead breakfast sandwich and two slices of white toast. He does not have a traditional lunch, he will snack on a nut mix during the day with OJ or he may have cottage cheese with fruit like apples or pears. For dinner he will have [employer]inner[employer] which is a meal delivery kit and a glass of 2% milk. He goes out to eat < 1-2x/week. Discussed including more whole  grains like whole wheat bread which he reports liking, potentially making his own brekafast sandwiches which he can freeze in bulk to have on hand, limiting OJ <2x/day, including a variety of vegetables,  and potentially having vegetables with dip like homemade tzatziki dip to throw into his rotation. Discussed heart healthy eating.      Intervention Plan   Intervention Prescribe, educate and counsel regarding individualized specific dietary modifications aiming towards targeted core components such as weight, hypertension, lipid management, diabetes, heart failure and other comorbidities.;Nutrition handout(s) given to patient.    Expected Outcomes Short Term Goal: Understand basic principles of dietary content, such as calories, fat, sodium, cholesterol and nutrients.;Short Term Goal: A plan has been developed with personal nutrition goals set during dietitian appointment.;Long Term Goal: Adherence to prescribed nutrition plan.             Nutrition Assessments:  MEDIFICTS Score Key: ?70 Need to make dietary changes  40-70 Heart Healthy Diet ? 40 Therapeutic Level Cholesterol Diet  Flowsheet Row Cardiac Rehab from 03/05/2021 in [facility] Cardiac and Pulmonary Rehab  Picture Your Plate Total Score on Admission 57      Picture Your Plate Scores: <35 Unhealthy dietary pattern with much room for improvement. 41-50 Dietary pattern unlikely to meet recommendations for good health and room for improvement. 51-60 More healthful dietary pattern, with some room for improvement.  >60 Healthy dietary pattern, although there may be some specific behaviors that could be improved.    Nutrition Goals Re-Evaluation:  Nutrition Goals Re-Evaluation     [place] Name 04/22/21 1730             Goals   Nutrition Goal ST: swap white bread for whole wheat bread at breakfast, try out making own breakfast sandwiches LT: limit saturated fat <12g/day, eat at least 5 fruits/vegetable servings per day, improve  vegetable variety, limit processed meat < 3x/week       Comment Hartley has been switching his breads occassionally but not every time yet. He is only eating out once/week and believes he is making good choices when eating out. He eats "dinnerly" meals with his family which is already pre-portioned and feels he gets a good portion of each food group recommendation.       Expected Outcome Short: Continue a full time switch to whole wheat bread Long: Maintain eating a heart healthy diet                Nutrition Goals Discharge (Final Nutrition Goals Re-Evaluation):  Nutrition Goals Re-Evaluation - 04/22/21 1730       Goals   Nutrition Goal ST: swap white bread for whole wheat bread at breakfast, try out making own breakfast sandwiches LT: limit saturated fat <12g/day, eat at least 5 fruits/vegetable servings per day, improve vegetable variety, limit processed meat < 3x/week    Comment [place] has been switching his breads occassionally but not every time yet. He is only eating out once/week and believes he is making good choices when eating out. He eats "dinnerly" meals with his family which is already pre-portioned and feels he gets a good portion of each food group recommendation.    Expected Outcome Short: Continue a full time switch to whole wheat bread Long: Maintain eating a heart healthy diet             Psychosocial: Target Goals: Acknowledge presence or absence of significant depression and/or stress, maximize coping skills, provide positive support system. Participant is able to verbalize types and ability to use techniques and skills needed for reducing stress and depression.   Education: Stress, Anxiety, and Depression - Group verbal and visual presentation to define topics covered.  Reviews how body is  impacted by stress, anxiety, and depression.  Also discusses healthy ways to reduce stress and to treat/manage anxiety and depression.  Written material given at  graduation. Flowsheet Row Cardiac Rehab from 05/08/2021 in [facility] Cardiac and Pulmonary Rehab  Education need identified 03/05/21  Date 05/01/21  Educator [facility]  Instruction Review Code 1- [employer] Understanding       Education: Sleep Hygiene -Provides group verbal and written instruction about how sleep can affect your health.  Define sleep hygiene, discuss sleep cycles and impact of sleep habits. Review good sleep hygiene tips.    Initial Review & Psychosocial Screening:  Initial Psych Review & Screening - 02/22/21 1511       Initial Review   Current issues with None Identified      Family Dynamics   Good Support System? Yes   family     Barriers   Psychosocial barriers to participate in program There are no identifiable barriers or psychosocial needs.;The patient should benefit from training in stress management and relaxation.      Screening Interventions   Interventions Encouraged to exercise;Provide feedback about the scores to participant;To provide support and resources with identified psychosocial needs    Expected Outcomes Short Term goal: Utilizing psychosocial counselor, staff and physician to assist with identification of specific Stressors or current issues interfering with healing process. Setting desired goal for each stressor or current issue identified.;Long Term Goal: Stressors or current issues are controlled or eliminated.;Short Term goal: Identification and review with participant of any Quality of Life or Depression concerns found by scoring the questionnaire.;Long Term goal: The participant improves quality of Life and PHQ9 Scores as seen by post scores and/or verbalization of changes             Quality of Life Scores:   Quality of Life - 03/05/21 0918       Quality of Life   Select Quality of Life      Quality of Life Scores   Health/Function Pre 28.03 %    Socioeconomic Pre 26.88 %    Psych/Spiritual Pre 29.29 %    Family Pre 29.5 %    GLOBAL  Pre 28.23 %            Scores of 19 and below usually indicate a poorer quality of life in these areas.  A difference of  2-3 points is a clinically meaningful difference.  A difference of 2-3 points in the total score of the Quality of Life Index has been associated with significant improvement in overall quality of life, self-image, physical symptoms, and general health in studies assessing change in quality of life.  PHQ-9: Recent Review Flowsheet Data     Depression screen [facility] 2/9 03/05/2021 12/31/2020 12/28/2019 09/11/2016 10/18/2014   Decreased Interest 0 0 0 0 0   Down, Depressed, Hopeless 0 0 0 0 0   PHQ - 2 Score 0 0 0 0 0   Altered sleeping 0 0 - 1 -   Tired, decreased energy 0 0 - 1 -   Change in appetite 0 0 - 0 -   Feeling bad or failure about yourself  0 0 - 0 -   Trouble concentrating 0 0 - 0 -   Moving slowly or fidgety/restless 0 0 - 0 -   Suicidal thoughts 0 0 - 0 -   PHQ-9 Score 0 0 - 2 -   Difficult doing work/chores Not difficult at all Not difficult at all - Not difficult at all -  Interpretation of Total Score  Total Score Depression Severity:  1-4 = Minimal depression, 5-9 = Mild depression, 10-14 = Moderate depression, 15-19 = Moderately severe depression, 20-27 = Severe depression   Psychosocial Evaluation and Intervention:  Psychosocial Evaluation - 02/22/21 1520       Psychosocial Evaluation & Interventions   Interventions Encouraged to exercise with the program and follow exercise prescription    Comments Cutberto reports feeling well after his stent. 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 His blockage came as a surprise as all his past physicals have been without significant findings. He started noticing some shortness of breath during work and then some chest discomfort so he reached out to his MD. After the stent he feels like he did prior to the start of his issues. He does not report any stress concerns at this time and states  he sleeps great. He wants to maintain a heart healthy lifestyle and it motivated to work hard    Expected Outcomes Short: attend cardiac rehab for education and exercise. Long:develop and maintain positive self care habits.    Continue Psychosocial Services  Follow up required by staff             Psychosocial Re-Evaluation:  Psychosocial Re-Evaluation     [place] Name 04/01/21 1717 04/22/21 1729           Psychosocial Re-Evaluation   Current issues with None Identified None Identified      Comments Patient reports no issues with their current mental states, sleep, stress, depression or anxiety. Will follow up with patient in a few weeks for any changes. Berdell is doing great mentally. He has good support from his wife & kids. He is looking forward to going to [place] for Christmas time to see his family. He is progressing well through the program and has felt a difference in his strength and stamina.      Expected Outcomes Short: Continue to exercise regularly to support mental health and notify staff of any changes. Long: maintain mental health and well being through teaching of rehab or prescribed medications independently. Short: Continue attendance with [facility] Long: Continue to maintain positive attitude      Interventions Encouraged to attend Cardiac Rehabilitation for the exercise Encouraged to attend Cardiac Rehabilitation for the exercise      Continue Psychosocial Services  Follow up required by staff Follow up required by staff               Psychosocial Discharge (Final Psychosocial Re-Evaluation):  Psychosocial Re-Evaluation - 04/22/21 1729       Psychosocial Re-Evaluation   Current issues with None Identified    Comments Zayden is doing great mentally. He has good support from his wife & kids. He is looking forward to going to [place] for Christmas time to see his family. He is progressing well through the program and has felt a difference in his strength and stamina.     Expected Outcomes Short: Continue attendance with [facility] Long: Continue to maintain positive attitude    Interventions Encouraged to attend Cardiac Rehabilitation for the exercise    Continue Psychosocial Services  Follow up required by staff             Vocational Rehabilitation: Provide vocational rehab assistance to qualifying candidates.   Vocational Rehab Evaluation & Intervention:  Vocational Rehab - 02/22/21 1511       Initial Vocational Rehab Evaluation & Intervention  Assessment shows need for Vocational Rehabilitation No             Education: Education Goals: Education classes will be provided on a variety of topics geared toward better understanding of heart health and risk factor modification. Participant will state understanding/return demonstration of topics presented as noted by education test scores.  Learning Barriers/Preferences:  Learning Barriers/Preferences - 02/22/21 1511       Learning Barriers/Preferences   Learning Barriers None    Learning Preferences None             General Cardiac Education Topics:  AED/CPR: - Group verbal and written instruction with the use of models to demonstrate the basic use of the AED with the basic ABC's of resuscitation.   Anatomy and Cardiac Procedures: - Group verbal and visual presentation and models provide information about basic cardiac anatomy and function. Reviews the testing methods done to diagnose heart disease and the outcomes of the test results. Describes the treatment choices: Medical Management, Angioplasty, or Coronary Bypass Surgery for treating various heart conditions including Myocardial Infarction, Angina, Valve Disease, and Cardiac Arrhythmias.  Written material given at graduation. Flowsheet Row Cardiac Rehab from 05/08/2021 in [facility] Cardiac and Pulmonary Rehab  Date 03/20/21  Educator sb  Instruction Review Code 1- Verbalizes Understanding       Medication Safety: - Group  verbal and visual instruction to review commonly prescribed medications for heart and lung disease. Reviews the medication, class of the drug, and side effects. Includes the steps to properly store meds and maintain the prescription regimen.  Written material given at graduation.   Intimacy: - Group verbal instruction through game format to discuss how heart and lung disease can affect sexual intimacy. Written material given at graduation.. Flowsheet Row Cardiac Rehab from 05/08/2021 in [facility] Cardiac and Pulmonary Rehab  Date 03/13/21  Educator [place]  Instruction Review Code 1- Verbalizes Understanding       Know Your Numbers and Heart Failure: - Group verbal and visual instruction to discuss disease risk factors for cardiac and pulmonary disease and treatment options.  Reviews associated critical values for Overweight/Obesity, Hypertension, Cholesterol, and Diabetes.  Discusses basics of heart failure: signs/symptoms and treatments.  Introduces Heart Failure Zone chart for action plan for heart failure.  Written material given at graduation. Flowsheet Row Cardiac Rehab from 05/08/2021 in [facility] Cardiac and Pulmonary Rehab  Date 04/17/21  Educator [facility]  Instruction Review Code 1- Verbalizes Understanding       Infection Prevention: - Provides verbal and written material to individual with discussion of infection control including proper hand washing and proper equipment cleaning during exercise session. Flowsheet Row Cardiac Rehab from 05/08/2021 in [facility] Cardiac and Pulmonary Rehab  Education need identified 03/05/21  Date 03/05/21  Educator [place]  Instruction Review Code 1- Verbalizes Understanding       Falls Prevention: - Provides verbal and written material to individual with discussion of falls prevention and safety. Flowsheet Row Cardiac Rehab from 05/08/2021 in [facility] Cardiac and Pulmonary Rehab  Education need identified 03/05/21  Date 03/05/21  Educator [place]  Instruction Review Code  1- Verbalizes Understanding       Other: -Provides group and verbal instruction on various topics (see comments)   Knowledge Questionnaire Score:  Knowledge Questionnaire Score - 03/05/21 0918       Knowledge Questionnaire Score   Pre Score 22/26: Exercise, Smoking, Depression             Core Components/Risk Factors/Patient Goals at Admission:  Personal Goals and Risk Factors at Admission - 03/05/21 0937       Core Components/Risk Factors/Patient Goals on Admission    Weight Management Yes;Weight Loss    Intervention Weight Management: Develop a combined nutrition and exercise program designed to reach desired caloric intake, while maintaining appropriate intake of nutrient and fiber, sodium and fats, and appropriate energy expenditure required for the weight goal.;Weight Management: Provide education and appropriate resources to help participant work on and attain dietary goals.;Weight Management/Obesity: Establish reasonable short term and long term weight goals.    Admit Weight 179 lb (81.2 kg)    Goal Weight: Short Term 175 lb (79.4 kg)    Goal Weight: Long Term 170 lb (77.1 kg)    Expected Outcomes Short Term: Continue to assess and modify interventions until short term weight is achieved;Long Term: Adherence to nutrition and physical activity/exercise program aimed toward attainment of established weight goal;Weight Loss: Understanding of general recommendations for a balanced deficit meal plan, which promotes 1-2 lb weight loss per week and includes a negative energy balance of [phone] kcal/d;Understanding recommendations for meals to include 15-35% energy as protein, 25-35% energy from fat, 35-60% energy from carbohydrates, less than 261m of dietary cholesterol, 20-35 gm of total fiber daily;Understanding of distribution of calorie intake throughout the day with the consumption of 4-5 meals/snacks    Lipids Yes    Intervention Provide education and support for participant  on nutrition & aerobic/resistive exercise along with prescribed medications to achieve LDL <722m HDL >4035m   Expected Outcomes Short Term: Participant states understanding of desired cholesterol values and is compliant with medications prescribed. Participant is following exercise prescription and nutrition guidelines.;Long Term: Cholesterol controlled with medications as prescribed, with individualized exercise RX and with personalized nutrition plan. Value goals: LDL < 42m42mDL > 40 mg.             Education:Diabetes - Individual verbal and written instruction to review signs/symptoms of diabetes, desired ranges of glucose level fasting, after meals and with exercise. Acknowledge that pre and post exercise glucose checks will be done for 3 sessions at entry of program.   Core Components/Risk Factors/Patient Goals Review:   Goals and Risk Factor Review     Row Name 04/01/21 1718 04/22/21 1740           Core Components/Risk Factors/Patient Goals Review   Personal Goals Review Weight Management/Obesity;Hypertension Weight Management/Obesity;Hypertension      Review KeviAlferdts to reach his weight goal of 170 pounds. Patients blood pressure has been doing well at rest and during exercise. Informed patient he could use a blood pressure cuff at home. KeviYerll does not have a blood pressure cuff and we talked about the importance of checking them once he graduates from rehab. His pressures at rehab have benen stable. KeviKoben lost 4 lbs since starting the program and wants to get down to 173 lb as a goal weight. He has been feeling palpitations sporadically recently and plans to discuss that with his cardiologist next week. He knows when to call emergency services if needed.      Expected Outcomes Short: maintain BP readings independently. Lose more weight. Long: get a blood pressure cuff to monitor pressure at home. Short: Get blood pressure cuff and talk to doctor Long: Continue to  manage lifestyle risk factors               Core Components/Risk Factors/Patient Goals at Discharge (Final Review):   Goals and Risk  Factor Review - 04/22/21 1740       Core Components/Risk Factors/Patient Goals Review   Personal Goals Review Weight Management/Obesity;Hypertension    Review Jessee still does not have a blood pressure cuff and we talked about the importance of checking them once he graduates from rehab. His pressures at rehab have benen stable. Samanyu has lost 4 lbs since starting the program and wants to get down to 173 lb as a goal weight. He has been feeling palpitations sporadically recently and plans to discuss that with his cardiologist next week. He knows when to call emergency services if needed.    Expected Outcomes Short: Get blood pressure cuff and talk to doctor Long: Continue to manage lifestyle risk factors             ITP Comments:  ITP Comments     Row Name 02/22/21 1517 03/05/21 0917 03/06/21 1710 03/20/21 0724 03/25/21 1736   ITP Comments Initial telephone orientation completed. Diagnosis can be found in [facility] 9/27. EP orientation scheduled for Tuesday 10/18 at 8am. Completed 6MWT and gym orientation. Initial ITP created and sent for review to Dr. Emily Filbert, Medical Director. First full day of exercise!  Patient was oriented to gym and equipment including functions, settings, policies, and procedures.  Patient's individual exercise prescription and treatment plan were reviewed.  All starting workloads were established based on the results of the 6 minute walk test done at initial orientation visit.  The plan for exercise progression was also introduced and progression will be customized based on patient's performance and goals. 30 Day review completed. Medical Director ITP review done, changes made as directed, and signed approval by Medical Director.   New to program Completed initial RD consultation    Row Name 04/17/21 0715 05/15/21 0735         ITP  Comments 30 Day review completed. Medical Director ITP review done, changes made as directed, and signed approval by Medical Director. 30 Day review completed. Medical Director ITP review done, changes made as directed, and signed approval by Medical Director.               Comments:

## 2021-05-15 NOTE — Progress Notes (Signed)
Daily Session Note  Patient Details  Name: PENN GRISSETT MRN: [id number] Date of Birth: 1970/05/20 Referring Provider:   Flowsheet Row Cardiac Rehab from 03/05/2021 in [facility] Cardiac and Pulmonary Rehab  Referring Provider End, Harrell Gave MD       Encounter Date: 05/15/2021  Check In:  Session Check In - 05/15/21 1639       Check-In   Supervising physician immediately available to respond to emergencies See telemetry face sheet for immediately available ER MD    Location [facility]-Cardiac & Pulmonary Rehab    Staff Present Renita Papa, RN BSN;Joseph Tessie Fass, RCP,RRT,BSRT;Melissa Irwin, [place], LDN    Virtual Visit No    Medication changes reported     No    Fall or balance concerns reported    No    Warm-up and Cool-down Performed on first and last piece of equipment    Resistance Training Performed Yes    VAD Patient? No    PAD/SET Patient? No      Pain Assessment   Currently in Pain? No/denies                Social History   Tobacco Use  Smoking Status Never  Smokeless Tobacco Never    Goals Met:  Independence with exercise equipment Exercise tolerated well No report of concerns or symptoms today Strength training completed today  Goals Unmet:  Not Applicable  Comments: Pt able to follow exercise prescription today without complaint.  Will continue to monitor for progression.    Dr. Emily Filbert is Medical Director for [place].  Dr. Ottie Glazier is Medical Director for [facility] Pulmonary Rehabilitation.

## 2021-05-22 ENCOUNTER — Encounter: Payer: BC Managed Care – PPO | Attending: Cardiology

## 2021-05-22 ENCOUNTER — Other Ambulatory Visit: Payer: Self-pay

## 2021-05-22 DIAGNOSIS — Z955 Presence of coronary angioplasty implant and graft: Secondary | ICD-10-CM | POA: Diagnosis not present

## 2021-05-22 NOTE — Progress Notes (Signed)
Daily Session Note  Patient Details  Name: John Christensen MRN: [id number] Date of Birth: [birth year] Referring Provider:   Flowsheet Row Cardiac Rehab from 03/05/2021 in [facility] Cardiac and Pulmonary Rehab  Referring Provider End, Harrell Gave MD       Encounter Date: 05/22/2021  Check In:  Session Check In - 05/22/21 1652       Check-In   Supervising physician immediately available to respond to emergencies See telemetry face sheet for immediately available ER MD    Location [facility]-Cardiac & Pulmonary Rehab    Staff Present Birdie Sons, MPA, RN;Joseph Lou Miner, MS, ASCM CEP, Exercise Physiologist    Virtual Visit No    Medication changes reported     No    Fall or balance concerns reported    No    Tobacco Cessation No Change    Warm-up and Cool-down Performed on first and last piece of equipment    Resistance Training Performed Yes    VAD Patient? No    PAD/SET Patient? No      Pain Assessment   Currently in Pain? No/denies                Social History   Tobacco Use  Smoking Status Never  Smokeless Tobacco Never    Goals Met:  Independence with exercise equipment Exercise tolerated well No report of concerns or symptoms today Strength training completed today  Goals Unmet:  Not Applicable  Comments: Pt able to follow exercise prescription today without complaint.  Will continue to monitor for progression.    Dr. Emily Filbert is Medical Director for [place].  Dr. Ottie Glazier is Medical Director for [facility] Pulmonary Rehabilitation.

## 2021-05-23 ENCOUNTER — Telehealth: Payer: Self-pay

## 2021-05-23 NOTE — Telephone Encounter (Signed)
Was able to reach out to John Christensen via phone and make contact to review his recent ZIO monitor results. John Listen, John Christensen advised based on the current results   Outpatient cardiac monitoring demonstrated a predominant rhythm of sinus with an average rate of 78 bpm (range 49 to 151 bpm), 4 runs of SVT with the fastest interval lasting 4 beats with a maximal rate of 144 bpm and the longest interval lasting 6 beats with an average rate of 130 bpm.  Isolated PACs were occasional, atrial couplets, triplets, and PVCs were rare.  Patient triggered events were associated with rare PACs.  Overall, reassuring outpatient cardiac monitor.  John Christensen verbalized understanding, is thankful for the results call, all questions and concerns were address. Will call back for anything further, f/u as schedule.

## 2021-05-27 ENCOUNTER — Other Ambulatory Visit: Payer: Self-pay

## 2021-05-27 ENCOUNTER — Encounter: Payer: BC Managed Care – PPO | Admitting: *Deleted

## 2021-05-27 VITALS — Ht 68.5 in | Wt 178.7 lb

## 2021-05-27 DIAGNOSIS — Z955 Presence of coronary angioplasty implant and graft: Secondary | ICD-10-CM | POA: Diagnosis not present

## 2021-05-27 NOTE — Progress Notes (Signed)
Daily Session Note  Patient Details  Name: John Christensen MRN: [id number] Date of Birth: March 14, 1971 Referring Provider:   Flowsheet Row Cardiac Rehab from 03/05/2021 in [facility] Cardiac and Pulmonary Rehab  Referring Provider End, Harrell Gave MD       Encounter Date: 05/27/2021  Check In:  Session Check In - 05/27/21 1702       Check-In   Supervising physician immediately available to respond to emergencies See telemetry face sheet for immediately available ER MD    Location [facility]-Cardiac & Pulmonary Rehab    Staff Present Renita Papa, RN BSN;Joseph [place], RCP,RRT,BSRT;Kara [place], [place], ASCM CEP, Exercise Physiologist    Virtual Visit No    Medication changes reported     No    Fall or balance concerns reported    No    Warm-up and Cool-down Performed on first and last piece of equipment    Resistance Training Performed Yes    VAD Patient? No    PAD/SET Patient? No      Pain Assessment   Currently in Pain? No/denies                Social History   Tobacco Use  Smoking Status Never  Smokeless Tobacco Never    Goals Met:  Independence with exercise equipment Exercise tolerated well No report of concerns or symptoms today Strength training completed today  Goals Unmet:  Not Applicable  Comments: Pt able to follow exercise prescription today without complaint.  Will continue to monitor for progression.'   Dr. Emily Filbert is Medical Director for [place].  Dr. Ottie Glazier is Medical Director for [facility] Pulmonary Rehabilitation.

## 2021-05-29 ENCOUNTER — Other Ambulatory Visit: Payer: Self-pay

## 2021-05-29 DIAGNOSIS — Z955 Presence of coronary angioplasty implant and graft: Secondary | ICD-10-CM

## 2021-05-29 NOTE — Progress Notes (Signed)
Daily Session Note  Patient Details  Name: John Christensen MRN: [id number] Date of Birth: 1970/08/29 Referring Provider:   Flowsheet Row Cardiac Rehab from 03/05/2021 in [facility] Cardiac and Pulmonary Rehab  Referring Provider End, Harrell Gave MD       Encounter Date: 05/29/2021  Check In:  Session Check In - 05/29/21 1648       Check-In   Supervising physician immediately available to respond to emergencies See telemetry face sheet for immediately available ER MD    Location [facility]-Cardiac & Pulmonary Rehab    Staff Present Birdie Sons, MPA, Nino Glow, MS, ASCM CEP, Exercise Physiologist    Virtual Visit No    Medication changes reported     No    Fall or balance concerns reported    No    Tobacco Cessation No Change    Warm-up and Cool-down Performed on first and last piece of equipment    Resistance Training Performed Yes    VAD Patient? No    PAD/SET Patient? No      Pain Assessment   Currently in Pain? No/denies                Social History   Tobacco Use  Smoking Status Never  Smokeless Tobacco Never    Goals Met:  Independence with exercise equipment Exercise tolerated well No report of concerns or symptoms today Strength training completed today  Goals Unmet:  Not Applicable  Comments: Pt able to follow exercise prescription today without complaint.  Will continue to monitor for progression.    Dr. Emily Filbert is Medical Director for [place].  Dr. Ottie Glazier is Medical Director for [facility] Pulmonary Rehabilitation.

## 2021-06-03 ENCOUNTER — Encounter: Payer: BC Managed Care – PPO | Admitting: *Deleted

## 2021-06-03 ENCOUNTER — Other Ambulatory Visit: Payer: Self-pay

## 2021-06-03 DIAGNOSIS — Z955 Presence of coronary angioplasty implant and graft: Secondary | ICD-10-CM

## 2021-06-03 NOTE — Progress Notes (Signed)
Daily Session Note  Patient Details  Name: ILIA ENGELBERT MRN: [id number] Date of Birth: [birth year] Referring Provider:   Flowsheet Row Cardiac Rehab from 03/05/2021 in [facility] Cardiac and Pulmonary Rehab  Referring Provider End, Harrell Gave MD       Encounter Date: 06/03/2021  Check In:  Session Check In - 06/03/21 1715       Check-In   Supervising physician immediately available to respond to emergencies See telemetry face sheet for immediately available ER MD    Location [facility]-Cardiac & Pulmonary Rehab    Staff Present Nyoka Cowden, RN, BSN, Tyna Jaksch, MS, ASCM CEP, Exercise Physiologist;Meredith Sherryll Burger, RN BSN    Virtual Visit No    Medication changes reported     No    Tobacco Cessation No Change    Warm-up and Cool-down Performed on first and last piece of equipment    Resistance Training Performed Yes    VAD Patient? No    PAD/SET Patient? No      Pain Assessment   Currently in Pain? No/denies                Social History   Tobacco Use  Smoking Status Never  Smokeless Tobacco Never    Goals Met:  Independence with exercise equipment Exercise tolerated well No report of concerns or symptoms today  Goals Unmet:  Not Applicable  Comments: Pt able to follow exercise prescription today without complaint.  Will continue to monitor for progression.    Dr. Emily Filbert is Medical Director for [place].  Dr. Ottie Glazier is Medical Director for [facility] Pulmonary Rehabilitation.

## 2021-06-05 ENCOUNTER — Encounter: Payer: BC Managed Care – PPO | Admitting: *Deleted

## 2021-06-05 ENCOUNTER — Other Ambulatory Visit: Payer: Self-pay

## 2021-06-05 DIAGNOSIS — Z955 Presence of coronary angioplasty implant and graft: Secondary | ICD-10-CM | POA: Diagnosis not present

## 2021-06-05 NOTE — Progress Notes (Signed)
Daily Session Note  Patient Details  Name: John Christensen MRN: [id number] Date of Birth: 1970/06/26 Referring Provider:   Flowsheet Row Cardiac Rehab from 03/05/2021 in [facility] Cardiac and Pulmonary Rehab  Referring Provider End, Harrell Gave MD       Encounter Date: 06/05/2021  Check In:  Session Check In - 06/05/21 1717       Check-In   Supervising physician immediately available to respond to emergencies See telemetry face sheet for immediately available ER MD    Location [facility]-Cardiac & Pulmonary Rehab    Staff Present Renita Papa, RN BSN;Joseph Jasper, RCP,RRT,BSRT;Kara [place], [place], ASCM CEP, Exercise Physiologist    Virtual Visit No    Medication changes reported     No    Fall or balance concerns reported    No    Warm-up and Cool-down Performed on first and last piece of equipment    Resistance Training Performed Yes    VAD Patient? No    PAD/SET Patient? No      Pain Assessment   Currently in Pain? No/denies                Social History   Tobacco Use  Smoking Status Never  Smokeless Tobacco Never    Goals Met:  Independence with exercise equipment Exercise tolerated well No report of concerns or symptoms today Strength training completed today  Goals Unmet:  Not Applicable  Comments:  John Christensen graduated today from  rehab with 36 sessions completed.  Details of the patient's exercise prescription and what He needs to do in order to continue the prescription and progress were discussed with patient.  Patient was given a copy of prescription and goals.  Patient verbalized understanding.  John Christensen plans to continue to exercise by attending [employer].    John Christensen is Medical Director for [place].  John Christensen is Medical Director for [facility] Pulmonary Rehabilitation.

## 2021-06-05 NOTE — Progress Notes (Signed)
Discharge Progress Report  Patient Details  Name: John KISE MRN: [id number] Date of Birth: 12/06/1970 Referring Provider:   Flowsheet Row Cardiac Rehab from 03/05/2021 in [facility] Cardiac and Pulmonary Rehab  Referring Provider End, Harrell Gave MD        Number of Visits: 36  Reason for Discharge:  Patient reached a stable level of exercise. Patient independent in their exercise. Patient has met program and personal goals.  Smoking History:  Social History   Tobacco Use  Smoking Status Never  Smokeless Tobacco Never    Diagnosis:  Status post coronary artery stent placement  ADL UCSD:   Initial Exercise Prescription:  Initial Exercise Prescription - 03/05/21 0900       Date of Initial Exercise RX and Referring Provider   Date 03/05/21    Referring Provider End, Harrell Gave MD      Treadmill   MPH 2.5    Grade 1.5    Minutes 15    METs 3.43      NuStep   Level 3    SPM 80    Minutes 15    METs 3.7      REL-XR   Level 3    Speed 50    Minutes 15    METs 3.7      Prescription Details   Frequency (times per week) 2    Duration Progress to 30 minutes of continuous aerobic without signs/symptoms of physical distress      Intensity   THRR 40-80% of Max Heartrate 110-149    Ratings of Perceived Exertion 11-13    Perceived Dyspnea 0-4      Progression   Progression Continue to progress workloads to maintain intensity without signs/symptoms of physical distress.      Resistance Training   Training Prescription Yes    Weight 4 lb    Reps 10-15             Discharge Exercise Prescription (Final Exercise Prescription Changes):  Exercise Prescription Changes - 05/22/21 1700       Response to Exercise   Blood Pressure (Admit) 118/60    Blood Pressure (Exit) 100/60    Heart Rate (Admit) 71 bpm    Heart Rate (Exercise) 126 bpm    Heart Rate (Exit) 97 bpm    Rating of Perceived Exertion (Exercise) 13    Symptoms John Christensen    Duration Continue  with 30 min of aerobic exercise without signs/symptoms of physical distress.    Intensity THRR unchanged      Progression   Progression Continue to progress workloads to maintain intensity without signs/symptoms of physical distress.    Average METs 5.58      Resistance Training   Training Prescription Yes    Weight 10 lb    Reps 10-15      Interval Training   Interval Training No      Treadmill   MPH 3.7    Grade 3.5    Minutes 15    METs 5.62      NuStep   Level 9    Minutes 15    METs 6.7      Home Exercise Plan   Plans to continue exercise at [employer] (comment)   [employer]   Frequency Add 2 additional days to program exercise sessions.   start with 1   Initial Home Exercises Provided 03/25/21      Oxygen   Maintain Oxygen Saturation 88% or higher  Functional Capacity:  6 Minute Walk     Row Name 03/05/21 0920 05/27/21 1732       6 Minute Walk   Phase Initial Discharge    Distance 1265 feet 1680 feet    Distance % Change -- 32.8 %    Distance Feet Change -- 415 ft    Walk Time 6 minutes 6 minutes    # of Rest Breaks 0 0    MPH 2.39 3.18    METS 3.77 4.95    RPE 7 11    Perceived Dyspnea  0 0    VO2 Peak 13.2 17.33    Symptoms No No    Resting HR 72 bpm 84 bpm    Resting BP 112/58 122/60    Resting Oxygen Saturation  98 % 97 %    Exercise Oxygen Saturation  during 6 min walk 100 % 97 %    Max Ex. HR 88 bpm 115 bpm    Max Ex. BP 124/72 154/68    2 Minute Post BP 108/76 --             Psychological, QOL, Others - Outcomes: PHQ 2/9: Depression screen [facility] 2/9 05/29/2021 03/05/2021 12/31/2020 12/28/2019 09/11/2016  Decreased Interest 0 0 0 0 0  Down, Depressed, Hopeless 0 0 0 0 0  PHQ - 2 Score 0 0 0 0 0  Altered sleeping 0 0 0 - 1  Tired, decreased energy 0 0 0 - 1  Change in appetite 0 0 0 - 0  Feeling bad or failure about yourself  0 0 0 - 0  Trouble concentrating 0 0 0 - 0  Moving slowly or fidgety/restless 0 0  0 - 0  Suicidal thoughts 0 0 0 - 0  PHQ-9 Score 0 0 0 - 2  Difficult doing work/chores Not difficult at all Not difficult at all Not difficult at all - Not difficult at all    Quality of Life:  Quality of Life - 05/29/21 1650       Quality of Life Scores   Health/Function Pre 28.03 %    Health/Function Post 29.83 %    Health/Function % Change 6.42 %    Socioeconomic Pre 26.88 %    Socioeconomic Post 29.69 %    Socioeconomic % Change  10.45 %    Psych/Spiritual Pre 29.29 %    Psych/Spiritual Post 30 %    Psych/Spiritual % Change 2.42 %    Family Pre 29.5 %    Family Post 30 %    Family % Change 1.69 %    GLOBAL Pre 28.23 %    GLOBAL Post 29.86 %    GLOBAL % Change 5.77 %             Nutrition & Weight - Outcomes:  Pre Biometrics - 03/05/21 0919       Pre Biometrics   Height 5' 8.5" (1.74 m)    Weight 179 lb 11.2 oz (81.5 kg)    BMI (Calculated) 26.92    Single Leg Stand 30 seconds             Post Biometrics - 05/27/21 1733        Post  Biometrics   Height 5' 8.5" (1.74 m)    Weight 178 lb 11.2 oz (81.1 kg)    BMI (Calculated) 26.77             Nutrition:  Nutrition Therapy & Goals - 03/25/21 1736  Nutrition Therapy   Diet Heart healthy, low Na    Drug/Food Interactions Statins/Certain Fruits    Protein (specify units) 65g    Fiber 30 grams    Whole Grain Foods 3 servings    Saturated Fats 12 max. grams    Fruits and Vegetables 8 servings/day    Sodium 1.5 grams      Personal Nutrition Goals   Nutrition Goal ST: swap white bread for whole wheat bread at breakfast, try out making own breakfast sandwiches LT: limit saturated fat <12g/day, eat at least 5 fruits/vegetable servings per day, improve vegetable variety, limit processed meat < 3x/week    Comments Patient is 51 y.o. M admitted to rehab for stent placement. PMH of CAD. Relevent medications: lipitor. PYP: 57:  Gervase reports that in the morning he has a jimmy dead breakfast  sandwich and two slices of white toast. He does not have a traditional lunch, he will snack on a nut mix during the day with OJ or he may have cottage cheese with fruit like apples or pears. For dinner he will have [employer] which is a meal delivery kit and a glass of 2% milk. He goes out to eat < 1-2x/week. Discussed including more whole grains like whole wheat bread which he reports liking, potentially making his own brekafast sandwiches which he can freeze in bulk to have on hand, limiting OJ <2x/day, including a variety of vegetables, and potentially having vegetables with dip like homemade tzatziki dip to throw into his rotation. Discussed heart healthy eating.      Intervention Plan   Intervention Prescribe, educate and counsel regarding individualized specific dietary modifications aiming towards targeted core components such as weight, hypertension, lipid management, diabetes, heart failure and other comorbidities.;Nutrition handout(s) given to patient.    Expected Outcomes Short Term Goal: Understand basic principles of dietary content, such as calories, fat, sodium, cholesterol and nutrients.;Short Term Goal: A plan has been developed with personal nutrition goals set during dietitian appointment.;Long Term Goal: Adherence to prescribed nutrition plan.             Education Questionnaire Score:  Knowledge Questionnaire Score - 05/29/21 1649       Knowledge Questionnaire Score   Pre Score 22/26: Exercise, Smoking, Depression    Post Score 26/26             Goals reviewed with patient; copy given to patient.

## 2021-06-05 NOTE — Progress Notes (Signed)
Cardiac Individual Treatment Plan  Patient Details  Name: John Christensen MRN: [id number] Date of Birth: 06-16-1970 Referring Provider:   Flowsheet Row Cardiac Rehab from 03/05/2021 in [facility] Cardiac and Pulmonary Rehab  Referring Provider End, Harrell Gave MD       Initial Encounter Date:  Flowsheet Row Cardiac Rehab from 03/05/2021 in [facility] Cardiac and Pulmonary Rehab  Date 03/05/21       Visit Diagnosis: Status post coronary artery stent placement  Patient's Home Medications on Admission:  Current Outpatient Medications:    acetaminophen (TYLENOL) 500 MG tablet, Take 500-1,000 mg by mouth every 6 (six) hours as needed for moderate pain., Disp: , Rfl:    albuterol (VENTOLIN HFA) 108 (90 Base) MCG/ACT inhaler, Inhale 2 puffs into the lungs every 6 (six) hours as needed for wheezing or shortness of breath., Disp: 8 g, Rfl: 2   aspirin EC 81 MG tablet, Take 1 tablet (81 mg total) by mouth daily. Swallow whole., Disp: 90 tablet, Rfl: 3   atorvastatin (LIPITOR) 40 MG tablet, Take 1 tablet (40 mg total) by mouth daily., Disp: 30 tablet, Rfl: 5   clopidogrel (PLAVIX) 75 MG tablet, Take 1 tablet (75 mg total) by mouth daily with breakfast., Disp: 90 tablet, Rfl: 3   nitroGLYCERIN (NITROSTAT) 0.4 MG SL tablet, Place 1 tablet (0.4 mg total) under the tongue every 5 (five) minutes as needed for chest pain. For a maximum of 3 doses, Disp: 25 tablet, Rfl: 1  Past Medical History: Past Medical History:  Diagnosis Date   Cardiac dysrhythmia, unspecified    Other malaise and fatigue    Unspecified congenital anomaly of heart     Tobacco Use: Social History   Tobacco Use  Smoking Status Never  Smokeless Tobacco Never    Labs: Recent Review Flowsheet Data     Labs for ITP Cardiac and Pulmonary Rehab Latest Ref Rng & Units 09/09/2016 09/25/2017 12/28/2019 12/31/2020 04/29/2021   Cholestrol 100 - 199 mg/dL 157 165 184 182 134   LDLCALC 0 - 99 mg/dL 85 98 110(H) 111(H) 67   LDLDIRECT 0 - 99  mg/dL - - - - 65   HDL >39 mg/dL 63 54 59 55 55   Trlycerides 0 - 149 mg/dL 47 51 65 69 56   Hemoglobin A1c <5.7 % of total Hgb - 4.7 4.5 4.7 -        Exercise Target Goals: Exercise Program Goal: Individual exercise prescription set using results from initial 6 min walk test and THRR while considering  patients activity barriers and safety.   Exercise Prescription Goal: Initial exercise prescription builds to 30-45 minutes a day of aerobic activity, 2-3 days per week.  Home exercise guidelines will be given to patient during program as part of exercise prescription that the participant will acknowledge.   Education: Aerobic Exercise: - Group verbal and visual presentation on the components of exercise prescription. Introduces F.I.T.T principle from ACSM for exercise prescriptions.  Reviews F.I.T.T. principles of aerobic exercise including progression. Written material given at graduation. Flowsheet Row Cardiac Rehab from 05/29/2021 in [facility] Cardiac and Pulmonary Rehab  Education need identified 03/05/21  Date 05/15/21  Educator AS  Instruction Review Code 1- Verbalizes Understanding       Education: Resistance Exercise: - Group verbal and visual presentation on the components of exercise prescription. Introduces F.I.T.T principle from ACSM for exercise prescriptions  Reviews F.I.T.T. principles of resistance exercise including progression. Written material given at graduation. Flowsheet Row Cardiac Rehab from 05/29/2021 in  [place] Cardiac and Pulmonary Rehab  Date 05/22/21  Educator AS  Instruction Review Code 1- Verbalizes Understanding        Education: Exercise & Equipment Safety: - Individual verbal instruction and demonstration of equipment use and safety with use of the equipment. Flowsheet Row Cardiac Rehab from 05/29/2021 in [facility] Cardiac and Pulmonary Rehab  Education need identified 03/05/21  Date 03/05/21  Educator [place]  Instruction Review Code 1- Verbalizes  Understanding       Education: Exercise Physiology & General Exercise Guidelines: - Group verbal and written instruction with models to review the exercise physiology of the cardiovascular system and associated critical values. Provides general exercise guidelines with specific guidelines to those with heart or lung disease.  Flowsheet Row Cardiac Rehab from 05/29/2021 in [facility] Cardiac and Pulmonary Rehab  Education need identified 03/05/21  Date 05/08/21  Educator AS  Instruction Review Code 1- Verbalizes Understanding       Education: Flexibility, Balance, Mind/Body Relaxation: - Group verbal and visual presentation with interactive activity on the components of exercise prescription. Introduces F.I.T.T principle from ACSM for exercise prescriptions. Reviews F.I.T.T. principles of flexibility and balance exercise training including progression. Also discusses the mind body connection.  Reviews various relaxation techniques to help reduce and manage stress (i.e. Deep breathing, progressive muscle relaxation, and visualization). Balance handout provided to take home. Written material given at graduation. Flowsheet Row Cardiac Rehab from 05/29/2021 in [facility] Cardiac and Pulmonary Rehab  Date 05/29/21  Educator AS  Instruction Review Code 1- Verbalizes Understanding       Activity Barriers & Risk Stratification:  Activity Barriers & Cardiac Risk Stratification - 03/05/21 0920       Activity Barriers & Cardiac Risk Stratification   Activity Barriers None    Cardiac Risk Stratification Moderate             6 Minute Walk:  6 Minute Walk     Row Name 03/05/21 0920 05/27/21 1732       6 Minute Walk   Phase Initial Discharge    Distance 1265 feet 1680 feet    Distance % Change -- 32.8 %    Distance Feet Change -- 415 ft    Walk Time 6 minutes 6 minutes    # of Rest Breaks 0 0    MPH 2.39 3.18    METS 3.77 4.95    RPE 7 11    Perceived Dyspnea  0 0    VO2 Peak 13.2 17.33     Symptoms No No    Resting HR 72 bpm 84 bpm    Resting BP 112/58 122/60    Resting Oxygen Saturation  98 % 97 %    Exercise Oxygen Saturation  during 6 min walk 100 % 97 %    Max Ex. HR 88 bpm 115 bpm    Max Ex. BP 124/72 154/68    2 Minute Post BP 108/76 --             Oxygen Initial Assessment:   Oxygen Re-Evaluation:   Oxygen Discharge (Final Oxygen Re-Evaluation):   Initial Exercise Prescription:  Initial Exercise Prescription - 03/05/21 0900       Date of Initial Exercise RX and Referring Provider   Date 03/05/21    Referring Provider End, Christopher MD      Treadmill   MPH 2.5    Grade 1.5    Minutes 15    METs 3.43      NuStep  Level 3    SPM 80    Minutes 15    METs 3.7      REL-XR   Level 3    Speed 50    Minutes 15    METs 3.7      Prescription Details   Frequency (times per week) 2    Duration Progress to 30 minutes of continuous aerobic without signs/symptoms of physical distress      Intensity   THRR 40-80% of Max Heartrate 110-149    Ratings of Perceived Exertion 11-13    Perceived Dyspnea 0-4      Progression   Progression Continue to progress workloads to maintain intensity without signs/symptoms of physical distress.      Resistance Training   Training Prescription Yes    Weight 4 lb    Reps 10-15             Perform Capillary Blood Glucose checks as needed.  Exercise Prescription Changes:   Exercise Prescription Changes     Row Name 03/05/21 0900 03/13/21 1300 03/25/21 0900 03/25/21 1800 04/10/21 1600     Response to Exercise   Blood Pressure (Admit) 112/78 102/64 112/60 -- 110/68   Blood Pressure (Exercise) 124/72 142/70 126/60 -- --   Blood Pressure (Exit) 108/76 112/68 124/72 -- 112/64   Heart Rate (Admit) 72 bpm 91 bpm 87 bpm -- 75 bpm   Heart Rate (Exercise) 88 bpm 150 bpm 143 bpm -- 151 bpm   Heart Rate (Exit) 68 bpm 84 bpm 98 bpm -- 90 bpm   Oxygen Saturation (Admit) 98 % -- -- -- --   Oxygen  Saturation (Exercise) 100 % -- -- -- --   Oxygen Saturation (Exit) 98 % -- -- -- --   Rating of Perceived Exertion (Exercise) 7 15 13  -- 13   Perceived Dyspnea (Exercise) 0 -- -- -- --   Symptoms none none none -- none   Comments walk test results second day -- -- --   Duration -- Continue with 30 min of aerobic exercise without signs/symptoms of physical distress. Continue with 30 min of aerobic exercise without signs/symptoms of physical distress. -- Continue with 30 min of aerobic exercise without signs/symptoms of physical distress.   Intensity -- THRR unchanged THRR unchanged -- THRR unchanged     Progression   Progression -- Continue to progress workloads to maintain intensity without signs/symptoms of physical distress. Continue to progress workloads to maintain intensity without signs/symptoms of physical distress. -- Continue to progress workloads to maintain intensity without signs/symptoms of physical distress.   Average METs -- 5.21 4.73 -- 5.6     Resistance Training   Training Prescription -- Yes Yes -- Yes   Weight -- 4 lb 6 lb -- 6 lb   Reps -- 10-15 10-15 -- 10-15     Interval Training   Interval Training -- -- No -- No     Treadmill   MPH -- 3 3.3 -- 3.5   Grade -- 1.5 1.5 -- 4   Minutes -- 15 15 -- 15   METs -- 3.92 4.21 -- 5.61     NuStep   Level -- 7 7 -- --   Minutes -- 15 15 -- --   METs -- 6.5 6.3 -- --     REL-XR   Level -- -- 7 -- --   Minutes -- -- 15 -- --     Home Exercise Plan   Plans to continue exercise at -- -- --  [employer] (comment)  [employer] (comment)  [employer]   Frequency -- -- -- Add 2 additional days to program exercise sessions.  start with 1 Add 2 additional days to program exercise sessions.  start with 1   Initial Home Exercises Provided -- -- -- 03/25/21 03/25/21    Row Name 04/22/21 1400 05/07/21 1500 05/22/21 1700         Response to Exercise   Blood Pressure (Admit) 110/68 106/64 118/60      Blood Pressure (Exit) 106/64 98/56 100/60     Heart Rate (Admit) 82 bpm 75 bpm 71 bpm     Heart Rate (Exercise) 149 bpm 131 bpm 126 bpm     Heart Rate (Exit) 95 bpm 110 bpm 97 bpm     Rating of Perceived Exertion (Exercise) _0 Symptoms none none none     Duration Continue with 30 min of aerobic exercise without signs/symptoms of physical distress. Continue with 30 min of aerobic exercise without signs/symptoms of physical distress. Continue with 30 min of aerobic exercise without signs/symptoms of physical distress.     Intensity THRR unchanged THRR unchanged THRR unchanged       Progression   Progression Continue to progress workloads to maintain intensity without signs/symptoms of physical distress. Continue to progress workloads to maintain intensity without signs/symptoms of physical distress. Continue to progress workloads to maintain intensity without signs/symptoms of physical distress.     Average METs 4.12 5.62 5.58       Resistance Training   Training Prescription Yes Yes Yes     Weight 7 lb 8 lb 10 lb     Reps 10-15 10-15 10-15       Interval Training   Interval Training No No No       Treadmill   MPH 3.6 3.5 3.7     Grade 4 4.5 3.5     Minutes _1 METs 5.74 5.86 5.62       NuStep   Level -- 8 9     Minutes -- 15 15     METs -- 7.1 6.7       Elliptical   Level 2 -- --     Speed 4.7 -- --     Minutes 15 -- --       REL-XR   Level 6 10 --     Minutes 15 15 --     METs 2.5 4.6 --       Home Exercise Plan   Plans to continue exercise at [employer] (comment)  [place] (comment)  [employer] (comment)  [employer]     Frequency Add 2 additional days to program exercise sessions.  start with 1 Add 2 additional days to program exercise sessions.  start with 1 Add 2 additional days to program exercise sessions.  start with 1     Initial Home Exercises Provided 03/25/21 03/25/21 03/25/21        Oxygen   Maintain Oxygen Saturation 88% or higher 88% or higher 88% or higher              Exercise Comments:   Exercise Goals and Review:   Exercise Goals     Row Name 03/05/21 0937             Exercise Goals   Increase Physical Activity Yes  Intervention Provide advice, education, support and counseling about physical activity/exercise needs.;Develop an individualized exercise prescription for aerobic and resistive training based on initial evaluation findings, risk stratification, comorbidities and participant's personal goals.       Expected Outcomes Short Term: Attend rehab on a regular basis to increase amount of physical activity.;Long Term: Add in home exercise to make exercise part of routine and to increase amount of physical activity.;Long Term: Exercising regularly at least 3-5 days a week.       Increase Strength and Stamina Yes       Intervention Provide advice, education, support and counseling about physical activity/exercise needs.;Develop an individualized exercise prescription for aerobic and resistive training based on initial evaluation findings, risk stratification, comorbidities and participant's personal goals.       Expected Outcomes Short Term: Increase workloads from initial exercise prescription for resistance, speed, and METs.;Short Term: Perform resistance training exercises routinely during rehab and add in resistance training at home;Long Term: Improve cardiorespiratory fitness, muscular endurance and strength as measured by increased METs and functional capacity (6MWT)       Able to understand and use rate of perceived exertion (RPE) scale Yes       Intervention Provide education and explanation on how to use RPE scale       Expected Outcomes Short Term: Able to use RPE daily in rehab to express subjective intensity level;Long Term:  Able to use RPE to guide intensity level when exercising independently       Able to understand and use  Dyspnea scale Yes       Intervention Provide education and explanation on how to use Dyspnea scale       Expected Outcomes Short Term: Able to use Dyspnea scale daily in rehab to express subjective sense of shortness of breath during exertion;Long Term: Able to use Dyspnea scale to guide intensity level when exercising independently       Knowledge and understanding of Target Heart Rate Range (THRR) Yes       Intervention Provide education and explanation of THRR including how the numbers were predicted and where they are located for reference       Expected Outcomes Short Term: Able to state/look up THRR;Long Term: Able to use THRR to govern intensity when exercising independently;Short Term: Able to use daily as guideline for intensity in rehab       Able to check pulse independently Yes       Intervention Provide education and demonstration on how to check pulse in carotid and radial arteries.;Review the importance of being able to check your own pulse for safety during independent exercise       Expected Outcomes Short Term: Able to explain why pulse checking is important during independent exercise;Long Term: Able to check pulse independently and accurately       Understanding of Exercise Prescription Yes       Intervention Provide education, explanation, and written materials on patient's individual exercise prescription       Expected Outcomes Short Term: Able to explain program exercise prescription;Long Term: Able to explain home exercise prescription to exercise independently                Exercise Goals Re-Evaluation :  Exercise Goals Re-Evaluation     Row Name 03/06/21 1713 03/13/21 1353 03/25/21 0940 03/25/21 1802 04/10/21 1603     Exercise Goal Re-Evaluation   Exercise Goals Review Increase Physical Activity;Understanding of Exercise Prescription;Knowledge and understanding of Target Heart Rate  Range (THRR);Able to understand and use rate of perceived exertion (RPE)  scale;Increase Strength and Stamina;Able to check pulse independently Increase Physical Activity;Increase Strength and Stamina Increase Physical Activity;Increase Strength and Stamina Increase Physical Activity;Increase Strength and Stamina;Understanding of Exercise Prescription Increase Physical Activity;Increase Strength and Stamina   Comments Reviewed RPE and dyspnea scales, THR and program prescription with pt today.  Pt voiced understanding and was given a copy of goals to take home. John Christensen has tolerated exercise well in his first two sessions.  He works in [employer] range.  Staff will monitor. John Christensen is doing well his first couple of weeks being here. He is consistently hitting his THR each time. He has already increased his speed on the treadmill to 3.3 and his handweights to 6 lbs. Will continue to monitor. Reviewed home exercise with pt today.  Pt plans to go back to [employer] for exercise.  He plans to do cardio machines as well as weights. Reviewed THR, pulse, RPE, sign and symptoms, pulse oximetery and when to call 911 or MD.  Also discussed weather considerations and indoor options.  Pt voiced understanding. John Christensen is progressing well and has increased to 3.5 mph and 4 % grade on TM.  He uses 7 lb for strength training.   Expected Outcomes Short: Use RPE daily to regulate intensity. Long: Follow program prescription in THR. Short: attend consistently Long: improve overall stamina Short: Continue to build up loads on treadmill Long: Continue to increase overall MET level Short: Attend [employer] again and add 1 day of exercise at home Long: Exercise independently at home at appropriate prescription Short: continue to exercise consistently Long: improve average MET level    Row Name 04/22/21 1422 04/22/21 1722 05/07/21 1545 05/22/21 1802 05/27/21 1736     Exercise Goal Re-Evaluation   Exercise Goals Review Increase Physical Activity;Increase Strength and Stamina;Understanding of Exercise  Prescription Increase Physical Activity;Increase Strength and Stamina;Understanding of Exercise Prescription Increase Physical Activity;Increase Strength and Stamina Increase Physical Activity;Increase Strength and Stamina Increase Physical Activity;Increase Strength and Stamina   Comments John Christensen is doing well in rehab.  He is up 8 lb weights and has tried level 9 on the XR.  He is also using the elliptical.  We will continue to monitor his progress. John Christensen has not joined [employer] yet but plans to join after Christmas. He walks his dog on the weekends (therefore totals 4 days of exercise/ week) and attempts to jog a little bit, which he is going to talk to his doctor about.   He tolerates it well. We stressed about checking his HR to make sure it stays in stable range and plans to get an exercise or smart watch for Christmas. John Christensen continues to do well in rehab. He worked up to 7.1 METS on the T4 Nustep! He also increased all the way to level 10 for the XR. Will continue to monitor. John Christensen continues to do great in rehab. He did increase to level 9 on the Nustep. He continues to hit his [place] during his exercise. He is due for his 6MWT next time and we should see significant improvement. John Christensen increased his post 6MWT by 32%!  John Christensen did join [employer] but has not attended yet. He plans to go with his wife who is good support with his exercise. When he goes to [employer], he will plan to do the treadmill, ellipticals, and resistance training. He does watch his HR during exercise and always hits his [place].   He  will be graduating in the next couple of weeks.   Expected Outcomes Short: Work back up on XR as only at 2.5 METs  Long: Continue to improve stamina. Short: Check HR and talk to doctor about running at home Long: Exercise indendently at home at appropriate prescription. Short: build up more incline on treadmill Long: Continue to increase overall MET level Short: Improve on 6MWT Long: Continue to build  up overall strength and stamina Short: Graduate Long: Continue independent exercise at appropriate prescription            Discharge Exercise Prescription (Final Exercise Prescription Changes):  Exercise Prescription Changes - 05/22/21 1700       Response to Exercise   Blood Pressure (Admit) 118/60    Blood Pressure (Exit) 100/60    Heart Rate (Admit) 71 bpm    Heart Rate (Exercise) 126 bpm    Heart Rate (Exit) 97 bpm    Rating of Perceived Exertion (Exercise) 13    Symptoms none    Duration Continue with 30 min of aerobic exercise without signs/symptoms of physical distress.    Intensity THRR unchanged      Progression   Progression Continue to progress workloads to maintain intensity without signs/symptoms of physical distress.    Average METs 5.58      Resistance Training   Training Prescription Yes    Weight 10 lb    Reps 10-15      Interval Training   Interval Training No      Treadmill   MPH 3.7    Grade 3.5    Minutes 15    METs 5.62      NuStep   Level 9    Minutes 15    METs 6.7      Home Exercise Plan   Plans to continue exercise at [employer] (comment)   [employer]   Frequency Add 2 additional days to program exercise sessions.   start with 1   Initial Home Exercises Provided 03/25/21      Oxygen   Maintain Oxygen Saturation 88% or higher             Nutrition:  Target Goals: Understanding of nutrition guidelines, daily intake of sodium <1579m, cholesterol <2012m calories 30% from fat and 7% or less from saturated fats, daily to have 5 or more servings of fruits and vegetables.  Education: All About Nutrition: -Group instruction provided by verbal, written material, interactive activities, discussions, models, and posters to present general guidelines for heart healthy nutrition including fat, fiber, MyPlate, the role of sodium in heart healthy nutrition, utilization of the nutrition label, and utilization of this knowledge for  meal planning. Follow up email sent as well. Written material given at graduation.   Biometrics:  Pre Biometrics - 03/05/21 0919       Pre Biometrics   Height 5' 8.5" (1.74 m)    Weight 179 lb 11.2 oz (81.5 kg)    BMI (Calculated) 26.92    Single Leg Stand 30 seconds             Post Biometrics - 05/27/21 1733        Post  Biometrics   Height 5' 8.5" (1.74 m)    Weight 178 lb 11.2 oz (81.1 kg)    BMI (Calculated) 26.77             Nutrition Therapy Plan and Nutrition Goals:  Nutrition Therapy & Goals - 03/25/21 1736  Nutrition Therapy   Diet Heart healthy, low Na    Drug/Food Interactions Statins/Certain Fruits    Protein (specify units) 65g    Fiber 30 grams    Whole Grain Foods 3 servings    Saturated Fats 12 max. grams    Fruits and Vegetables 8 servings/day    Sodium 1.5 grams      Personal Nutrition Goals   Nutrition Goal ST: swap white bread for whole wheat bread at breakfast, try out making own breakfast sandwiches LT: limit saturated fat <12g/day, eat at least 5 fruits/vegetable servings per day, improve vegetable variety, limit processed meat < 3x/week    Comments Patient is 51 y.o. M admitted to rehab for stent placement. PMH of CAD. Relevent medications: lipitor. PYP: 57:  [place] reports that in the morning he has a jimmy dead breakfast sandwich and two slices of white toast. He does not have a traditional lunch, he will snack on a nut mix during the day with OJ or he may have cottage cheese with fruit like apples or pears. For dinner he will have [employer] which is a meal delivery kit and a glass of 2% milk. He goes out to eat < 1-2x/week. Discussed including more whole grains like whole wheat bread which he reports liking, potentially making his own brekafast sandwiches which he can freeze in bulk to have on hand, limiting OJ <2x/day, including a variety of vegetables, and potentially having vegetables with dip like homemade tzatziki dip to throw into  his rotation. Discussed heart healthy eating.      Intervention Plan   Intervention Prescribe, educate and counsel regarding individualized specific dietary modifications aiming towards targeted core components such as weight, hypertension, lipid management, diabetes, heart failure and other comorbidities.;Nutrition handout(s) given to patient.    Expected Outcomes Short Term Goal: Understand basic principles of dietary content, such as calories, fat, sodium, cholesterol and nutrients.;Short Term Goal: A plan has been developed with personal nutrition goals set during dietitian appointment.;Long Term Goal: Adherence to prescribed nutrition plan.             Nutrition Assessments:  MEDIFICTS Score Key: ?70 Need to make dietary changes  40-70 Heart Healthy Diet ? 40 Therapeutic Level Cholesterol Diet  Flowsheet Row Cardiac Rehab from 05/29/2021 in [facility] Cardiac and Pulmonary Rehab  Picture Your Plate Total Score on Admission 57  Picture Your Plate Total Score on Discharge 60      Picture Your Plate Scores: <92 Unhealthy dietary pattern with much room for improvement. 41-50 Dietary pattern unlikely to meet recommendations for good health and room for improvement. 51-60 More healthful dietary pattern, with some room for improvement.  >60 Healthy dietary pattern, although there may be some specific behaviors that could be improved.    Nutrition Goals Re-Evaluation:  Nutrition Goals Re-Evaluation     Row Name 04/22/21 1730 05/27/21 1739           Goals   Nutrition Goal ST: swap white bread for whole wheat bread at breakfast, try out making own breakfast sandwiches LT: limit saturated fat <12g/day, eat at least 5 fruits/vegetable servings per day, improve vegetable variety, limit processed meat < 3x/week ST: swap white bread for whole wheat bread at breakfast, try out making own breakfast sandwiches LT: limit saturated fat <12g/day, eat at least 5 fruits/vegetable servings per day,  improve vegetable variety, limit processed meat < 3x/week      Comment John Christensen has been switching his breads occassionally but not every time yet.  He is only eating out once/week and believes he is making good choices when eating out. He eats "dinnerly" meals with his family which is already pre-portioned and feels he gets a good portion of each food group recommendation. John Christensen has switched to wheat bread and has limited processed meat with his lunches. For lunch, he is now eating apples, oranges, nut mix, and wheat bread. I encouraged him to make sure he was eating enough protein to account for his exercise and activity. He plans to add in peanut butter to some of his snacks. OVerall, he feels please with his changes and will continue to work on them. Denies any other problems at this time.      Expected Outcome Short: Continue a full time switch to whole wheat bread Long: Maintain eating a heart healthy diet Short: Graduate Long: Continue heart healthy diet               Nutrition Goals Discharge (Final Nutrition Goals Re-Evaluation):  Nutrition Goals Re-Evaluation - 05/27/21 1739       Goals   Nutrition Goal ST: swap white bread for whole wheat bread at breakfast, try out making own breakfast sandwiches LT: limit saturated fat <12g/day, eat at least 5 fruits/vegetable servings per day, improve vegetable variety, limit processed meat < 3x/week    Comment John Christensen has switched to wheat bread and has limited processed meat with his lunches. For lunch, he is now eating apples, oranges, nut mix, and wheat bread. I encouraged him to make sure he was eating enough protein to account for his exercise and activity. He plans to add in peanut butter to some of his snacks. OVerall, he feels please with his changes and will continue to work on them. Denies any other problems at this time.    Expected Outcome Short: Graduate Long: Continue heart healthy diet             Psychosocial: Target Goals:  Acknowledge presence or absence of significant depression and/or stress, maximize coping skills, provide positive support system. Participant is able to verbalize types and ability to use techniques and skills needed for reducing stress and depression.   Education: Stress, Anxiety, and Depression - Group verbal and visual presentation to define topics covered.  Reviews how body is impacted by stress, anxiety, and depression.  Also discusses healthy ways to reduce stress and to treat/manage anxiety and depression.  Written material given at graduation. Flowsheet Row Cardiac Rehab from 05/29/2021 in [facility] Cardiac and Pul[facility] Rehab  Education need identified 03/05/21  Date 05/01/21  Educator [facility]  Instruction Review Code 1- [employer] Understanding       Education: Sleep Hygiene -Provides group verbal and written instruction about how sleep can affect your health.  Define sleep hygiene, discuss sleep cycles and impact of sleep habits. Review good sleep hygiene tips.    Initial Review & Psychosocial Screening:  Initial Psych Review & Screening - 02/22/21 1511       Initial Review   Current issues with None Identified      Family Dynamics   Good Support System? Yes   family     Barriers   Psychosocial barriers to participate in program There are no identifiable barriers or psychosocial needs.;The patient should benefit from training in stress management and relaxation.      Screening Interventions   Interventions Encouraged to exercise;Provide feedback about the scores to participant;To provide support and resources with identified psychosocial needs    Expected Outcomes Short Term goal:  Utilizing psychosocial counselor, staff and physician to assist with identification of specific Stressors or current issues interfering with healing process. Setting desired goal for each stressor or current issue identified.;Long Term Goal: Stressors or current issues are controlled or eliminated.;Short  Term goal: Identification and review with participant of any Quality of Life or Depression concerns found by scoring the questionnaire.;Long Term goal: The participant improves quality of Life and PHQ9 Scores as seen by post scores and/or verbalization of changes             Quality of Life Scores:   Quality of Life - 05/29/21 1650       Quality of Life Scores   Health/Function Pre 28.03 %    Health/Function Post 29.83 %    Health/Function % Change 6.42 %    Socioeconomic Pre 26.88 %    Socioeconomic Post 29.69 %    Socioeconomic % Change  10.45 %    Psych/Spiritual Pre 29.29 %    Psych/Spiritual Post 30 %    Psych/Spiritual % Change 2.42 %    Family Pre 29.5 %    Family Post 30 %    Family % Change 1.69 %    GLOBAL Pre 28.23 %    GLOBAL Post 29.86 %    GLOBAL % Change 5.77 %            Scores of 19 and below usually indicate a poorer quality of life in these areas.  A difference of  2-3 points is a clinically meaningful difference.  A difference of 2-3 points in the total score of the Quality of Life Index has been associated with significant improvement in overall quality of life, self-image, physical symptoms, and general health in studies assessing change in quality of life.  PHQ-9: Recent Review Flowsheet Data     Depression screen [facility] 2/9 05/29/2021 03/05/2021 12/31/2020 12/28/2019 09/11/2016   Decreased Interest 0 0 0 0 0   Down, Depressed, Hopeless 0 0 0 0 0   PHQ - 2 Score 0 0 0 0 0   Altered sleeping 0 0 0 - 1   Tired, decreased energy 0 0 0 - 1   Change in appetite 0 0 0 - 0   Feeling bad or failure about yourself  0 0 0 - 0   Trouble concentrating 0 0 0 - 0   Moving slowly or fidgety/restless 0 0 0 - 0   Suicidal thoughts 0 0 0 - 0   PHQ-9 Score 0 0 0 - 2   Difficult doing work/chores Not difficult at all Not difficult at all Not difficult at all - Not difficult at all      Interpretation of Total Score  Total Score Depression Severity:  1-4 = Minimal  depression, 5-9 = Mild depression, 10-14 = Moderate depression, 15-19 = Moderately severe depression, 20-27 = Severe depression   Psychosocial Evaluation and Intervention:  Psychosocial Evaluation - 02/22/21 1520       Psychosocial Evaluation & Interventions   Interventions Encouraged to exercise with the program and follow exercise prescription    Comments John Christensen reports feeling well after his stent. 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 His blockage came as a surprise as all his past physicals have been without significant findings. He started noticing some shortness of breath during work and then some chest discomfort so he reached out to his MD. After the stent he feels like he did prior to the  start of his issues. He does not report any stress concerns at this time and states he sleeps great. He wants to maintain a heart healthy lifestyle and it motivated to work hard    Expected Outcomes Short: attend cardiac rehab for education and exercise. Long:develop and maintain positive self care habits.    Continue Psychosocial Services  Follow up required by staff             Psychosocial Re-Evaluation:  Psychosocial Re-Evaluation     [place] Name 04/01/21 1717 04/22/21 1729 05/27/21 1741         Psychosocial Re-Evaluation   Current issues with None Identified None Identified None Identified     Comments Patient reports no issues with their current mental states, sleep, stress, depression or anxiety. Will follow up with patient in a few weeks for any changes. John Christensen is doing great mentally. He has good support from his wife & kids. He is looking forward to going to [place] for Christmas time to see his family. He is progressing well through the program and has felt a difference in his strength and stamina. John Christensen has been very pleased with the program- he feels better overall, epsecially his endurance. He feels he can do more at work. He is ready to head to [employer] for his  exercise with his wife who is good support. He will be graduating in the next couple of weeks     Expected Outcomes Short: Continue to exercise regularly to support mental health and notify staff of any changes. Long: maintain mental health and well being through teaching of rehab or prescribed medications independently. Short: Continue attendance with [facility] Long: Continue to maintain positive attitude Short: Graduate Long: Continue exercise for stress management and maintain positive attitude     Interventions Encouraged to attend Cardiac Rehabilitation for the exercise Encouraged to attend Cardiac Rehabilitation for the exercise Encouraged to attend Cardiac Rehabilitation for the exercise     Continue Psychosocial Services  Follow up required by staff Follow up required by staff Follow up required by staff              Psychosocial Discharge (Final Psychosocial Re-Evaluation):  Psychosocial Re-Evaluation - 05/27/21 1741       Psychosocial Re-Evaluation   Current issues with None Identified    Comments John Christensen has been very pleased with the program- he feels better overall, epsecially his endurance. He feels he can do more at work. He is ready to head to [employer] for his exercise with his wife who is good support. He will be graduating in the next couple of weeks    Expected Outcomes Short: Graduate Long: Continue exercise for stress management and maintain positive attitude    Interventions Encouraged to attend Cardiac Rehabilitation for the exercise    Continue Psychosocial Services  Follow up required by staff             Vocational Rehabilitation: Provide vocational rehab assistance to qualifying candidates.   Vocational Rehab Evaluation & Intervention:  Vocational Rehab - 02/22/21 1511       Initial Vocational Rehab Evaluation & Intervention   Assessment shows need for Vocational Rehabilitation No             Education: Education Goals: Education classes  will be provided on a variety of topics geared toward better understanding of heart health and risk factor modification. Participant will state understanding/return demonstration of topics presented as noted by education test scores.  Learning Barriers/Preferences:  Learning Barriers/Preferences - 02/22/21 1511       Learning Barriers/Preferences   Learning Barriers None    Learning Preferences None             General Cardiac Education Topics:  AED/CPR: - Group verbal and written instruction with the use of models to demonstrate the basic use of the AED with the basic ABC's of resuscitation.   Anatomy and Cardiac Procedures: - Group verbal and visual presentation and models provide information about basic cardiac anatomy and function. Reviews the testing methods done to diagnose heart disease and the outcomes of the test results. Describes the treatment choices: Medical Management, Angioplasty, or Coronary Bypass Surgery for treating various heart conditions including Myocardial Infarction, Angina, Valve Disease, and Cardiac Arrhythmias.  Written material given at graduation. Flowsheet Row [facility]  Date 05/22/21  Educator [facility]  Instruction Review Code 1- Verbalizes Understanding       Medication Safety: - Group verbal and visual instruction to review commonly prescribed medications for heart and lung disease. Reviews the medication, class of the drug, and side effects. Includes the steps to properly store meds and maintain the prescription regimen.  Written material given at graduation.   Intimacy: - Group verbal instruction through game format to discuss how heart and lung disease can affect sexual intimacy. Written material given at graduation.. Flowsheet Row Cardiac Rehab from 05/29/2021 in [facility] Cardiac and Pulmonary Rehab  Date 05/15/21  Educator [place]  Instruction Review Code 1- Verbalizes Understanding       Know  Your Numbers and Heart Failure: - Group verbal and visual instruction to discuss disease risk factors for cardiac and pulmonary disease and treatment options.  Reviews associated critical values for Overweight/Obesity, Hypertension, Cholesterol, and Diabetes.  Discusses basics of heart failure: signs/symptoms and treatments.  Introduces Heart Failure Zone chart for action plan for heart failure.  Written material given at graduation. Flowsheet Row Cardiac Rehab from 05/29/2021 in [facility] Cardiac and Pulmonary Rehab  Date 04/17/21  Educator [facility]  Instruction Review Code 1- Verbalizes Understanding       Infection Prevention: - Provides verbal and written material to individual with discussion of infection control including proper hand washing and proper equipment cleaning during exercise session. Flowsheet Row Cardiac Rehab from 05/29/2021 in [facility] Cardiac and Pulmonary Rehab  Education need identified 03/05/21  Date 03/05/21  Educator [place]  Instruction Review Code 1- Verbalizes Understanding       Falls Prevention: - Provides verbal and written material to individual with discussion of falls prevention and safety. Flowsheet Row Cardiac Rehab from 05/29/2021 in [facility] Cardiac and Pulmonary Rehab  Education need identified 03/05/21  Date 03/05/21  Educator [place]  Instruction Review Code 1- Verbalizes Understanding       Other: -Provides group and verbal instruction on various topics (see comments)   Knowledge Questionnaire Score:  Knowledge Questionnaire Score - 05/29/21 1649       Knowledge Questionnaire Score   Pre Score 22/26: Exercise, Smoking, Depression    Post Score 26/26             Core Components/Risk Factors/Patient Goals at Admission:  Personal Goals and Risk Factors at Admission - 03/05/21 0937       Core Components/Risk Factors/Patient Goals on Admission    Weight Management Yes;Weight Loss    Intervention Weight Management: Develop a combined nutrition and  exercise program designed to reach desired caloric intake, while maintaining appropriate intake of nutrient  and fiber, sodium and fats, and appropriate energy expenditure required for the weight goal.;Weight Management: Provide education and appropriate resources to help participant work on and attain dietary goals.;Weight Management/Obesity: Establish reasonable short term and long term weight goals.    Admit Weight 179 lb (81.2 kg)    Goal Weight: Short Term 175 lb (79.4 kg)    Goal Weight: Long Term 170 lb (77.1 kg)    Expected Outcomes Short Term: Continue to assess and modify interventions until short term weight is achieved;Long Term: Adherence to nutrition and physical activity/exercise program aimed toward attainment of established weight goal;Weight Loss: Understanding of general recommendations for a balanced deficit meal plan, which promotes 1-2 lb weight loss per week and includes a negative energy balance of [phone] kcal/d;Understanding recommendations for meals to include 15-35% energy as protein, 25-35% energy from fat, 35-60% energy from carbohydrates, less than 231m of dietary cholesterol, 20-35 gm of total fiber daily;Understanding of distribution of calorie intake throughout the day with the consumption of 4-5 meals/snacks    Lipids Yes    Intervention Provide education and support for participant on nutrition & aerobic/resistive exercise along with prescribed medications to achieve LDL <751m HDL >4062m   Expected Outcomes Short Term: Participant states understanding of desired cholesterol values and is compliant with medications prescribed. Participant is following exercise prescription and nutrition guidelines.;Long Term: Cholesterol controlled with medications as prescribed, with individualized exercise RX and with personalized nutrition plan. Value goals: LDL < 24m20mDL > 40 mg.             Education:Diabetes - Individual verbal and written instruction to review  signs/symptoms of diabetes, desired ranges of glucose level fasting, after meals and with exercise. Acknowledge that pre and post exercise glucose checks will be done for 3 sessions at entry of program.   Core Components/Risk Factors/Patient Goals Review:   Goals and Risk Factor Review     Row Name 04/01/21 1718 04/22/21 1740 05/27/21 1744         Core Components/Risk Factors/Patient Goals Review   Personal Goals Review Weight Management/Obesity;Hypertension Weight Management/Obesity;Hypertension Weight Management/Obesity;Hypertension     Review KeviSrinivasts to reach his weight goal of 170 pounds. Patients blood pressure has been doing well at rest and during exercise. Informed patient he could use a blood pressure cuff at home. KeviCaitlinll does not have a blood pressure cuff and we talked about the importance of checking them once he graduates from rehab. His pressures at rehab have benen stable. KeviIndie lost 4 lbs since starting the program and wants to get down to 173 lb as a goal weight. He has been feeling palpitations sporadically recently and plans to discuss that with his cardiologist next week. He knows when to call emergency services if needed. KeviHikeem been the same weight since he started the program but still weighs himself routinely. His blood pressure at rehab have been stable with no abnormal readings. He  did not obtain a BP cuff for home and I encouraged him to get one to ensure his BPs stay in good range after rehab. He just finished his holter and got results back from his doctor who stated everything looked good and did not need any other medications. He will be graduating in the next couple of weeks.     Expected Outcomes Short: maintain BP readings independently. Lose more weight. Long: get a blood pressure cuff to monitor pressure at home. Short: Get blood pressure cuff and talk to doctor  Long: Continue to manage lifestyle risk factors Short: Continue checking weight and BP at  home and graduate Long: Manage lifestyle risk factors              Core Components/Risk Factors/Patient Goals at Discharge (Final Review):   Goals and Risk Factor Review - 05/27/21 1744       Core Components/Risk Factors/Patient Goals Review   Personal Goals Review Weight Management/Obesity;Hypertension    Review John Christensen has been the same weight since he started the program but still weighs himself routinely. His blood pressure at rehab have been stable with no abnormal readings. He  did not obtain a BP cuff for home and I encouraged him to get one to ensure his BPs stay in good range after rehab. He just finished his holter and got results back from his doctor who stated everything looked good and did not need any other medications. He will be graduating in the next couple of weeks.    Expected Outcomes Short: Continue checking weight and BP at home and graduate Long: Manage lifestyle risk factors             ITP Comments:  ITP Comments     Row Name 02/22/21 1517 03/05/21 0917 03/06/21 1710 03/20/21 0724 03/25/21 1736   ITP Comments Initial telephone orientation completed. Diagnosis can be [facility] in [facility] 9[facility]. EP orientation scheduled for Tuesday 10/18 at 8am. Completed 6MWT and gym orientation. Initial ITP created and sent for review to Dr. Emily Filbert, Medical Director. First full day of exercise!  Patient was oriented to gym and equipment including functions, settings, policies, and procedures.  Patient's individual exercise prescription and treatment plan were reviewed.  All starting workloads were established based on the results of the 6 minute walk test done at initial orientation visit.  The plan for exercise progression was also introduced and progression will be customized based on patient's performance and goals. 30 Day review completed. Medical Director ITP review done, changes made as directed, and signed approval by Medical Director.   New to program Completed initial RD  consultation    Row Name 04/17/21 0715 05/15/21 0735 06/05/21 1718       ITP Comments 30 Day review completed. Medical Director ITP review done, changes made as directed, and signed approval by Medical Director. 30 Day review completed. Medical Director ITP review done, changes made as directed, and signed approval by Medical Director. John Christensen graduated today from  rehab with 36 sessions completed.  Details of the patient's exercise prescription and what He needs to do in order to continue the prescription and progress were discussed with patient.  Patient was given a copy of prescription and goals.  Patient verbalized understanding.  John Christensen plans to continue to exercise by attending [employer].              Comments: Discharge ITP

## 2021-06-05 NOTE — Patient Instructions (Signed)
Discharge Patient Instructions  Patient Details  Name: John Christensen MRN: [id number] Date of Birth: Oct 18, 1970 Referring Provider:  Kate Sable, MD   Number of Visits: 68  Reason for Discharge:  Patient reached a stable level of exercise. Patient independent in their exercise. Patient has met program and personal goals.  Smoking History:  Social History   Tobacco Use  Smoking Status Never  Smokeless Tobacco Never    Diagnosis:  Status post coronary artery stent placement  Initial Exercise Prescription:  Initial Exercise Prescription - 03/05/21 0900       Date of Initial Exercise RX and Referring Provider   Date 03/05/21    Referring Provider End, Harrell Gave MD      Treadmill   MPH 2.5    Grade 1.5    Minutes 15    METs 3.43      NuStep   Level 3    SPM 80    Minutes 15    METs 3.7      REL-XR   Level 3    Speed 50    Minutes 15    METs 3.7      Prescription Details   Frequency (times per week) 2    Duration Progress to 30 minutes of continuous aerobic without signs/symptoms of physical distress      Intensity   THRR 40-80% of Max Heartrate 110-149    Ratings of Perceived Exertion 11-13    Perceived Dyspnea 0-4      Progression   Progression Continue to progress workloads to maintain intensity without signs/symptoms of physical distress.      Resistance Training   Training Prescription Yes    Weight 4 lb    Reps 10-15             Discharge Exercise Prescription (Final Exercise Prescription Changes):  Exercise Prescription Changes - 05/22/21 1700       Response to Exercise   Blood Pressure (Admit) 118/60    Blood Pressure (Exit) 100/60    Heart Rate (Admit) 71 bpm    Heart Rate (Exercise) 126 bpm    Heart Rate (Exit) 97 bpm    Rating of Perceived Exertion (Exercise) 13    Symptoms none    Duration Continue with 30 min of aerobic exercise without signs/symptoms of physical distress.    Intensity THRR unchanged       Progression   Progression Continue to progress workloads to maintain intensity without signs/symptoms of physical distress.    Average METs 5.58      Resistance Training   Training Prescription Yes    Weight 10 lb    Reps 10-15      Interval Training   Interval Training No      Treadmill   MPH 3.7    Grade 3.5    Minutes 15    METs 5.62      NuStep   Level 9    Minutes 15    METs 6.7      Home Exercise Plan   Plans to continue exercise at [employer] (comment)   [employer]   Frequency Add 2 additional days to program exercise sessions.   start with 1   Initial Home Exercises Provided 03/25/21      Oxygen   Maintain Oxygen Saturation 88% or higher             Functional Capacity:  6 Minute Walk     Row Name 03/05/21 0920 05/27/21 1732  6 Minute Walk   Phase Initial Discharge    Distance 1265 feet 1680 feet    Distance % Change -- 32.8 %    Distance Feet Change -- 415 ft    Walk Time 6 minutes 6 minutes    # of Rest Breaks 0 0    MPH 2.39 3.18    METS 3.77 4.95    RPE 7 11    Perceived Dyspnea  0 0    VO2 Peak 13.2 17.33    Symptoms No No    Resting HR 72 bpm 84 bpm    Resting BP 112/58 122/60    Resting Oxygen Saturation  98 % 97 %    Exercise Oxygen Saturation  during 6 min walk 100 % 97 %    Max Ex. HR 88 bpm 115 bpm    Max Ex. BP 124/72 154/68    2 Minute Post BP 108/76 --              Nutrition & Weight - Outcomes:  Pre Biometrics - 03/05/21 0919       Pre Biometrics   Height 5' 8.5" (1.74 m)    Weight 179 lb 11.2 oz (81.5 kg)    BMI (Calculated) 26.92    Single Leg Stand 30 seconds             Post Biometrics - 05/27/21 1733        Post  Biometrics   Height 5' 8.5" (1.74 m)    Weight 178 lb 11.2 oz (81.1 kg)    BMI (Calculated) 26.77             Nutrition:  Nutrition Therapy & Goals - 03/25/21 1736       Nutrition Therapy   Diet Heart healthy, low Na    Drug/Food Interactions Statins/Certain  Fruits    Protein (specify units) 65g    Fiber 30 grams    Whole Grain Foods 3 servings    Saturated Fats 12 max. grams    Fruits and Vegetables 8 servings/day    Sodium 1.5 grams      Personal Nutrition Goals   Nutrition Goal ST: swap white bread for whole wheat bread at breakfast, try out making own breakfast sandwiches LT: limit saturated fat <12g/day, eat at least 5 fruits/vegetable servings per day, improve vegetable variety, limit processed meat < 3x/week    Comments Patient is 51 y.o. M admitted to rehab for stent placement. PMH of CAD. Relevent medications: lipitor. PYP: 57:  Conlan reports that in the morning he has a jimmy dead breakfast sandwich and two slices of white toast. He does not have a traditional lunch, he will snack on a nut mix during the day with OJ or he may have cottage cheese with fruit like apples or pears. For dinner he will have [employer]inner[employer] which is a meal delivery kit and a glass of 2% milk. He goes out to eat < 1-2x/week. Discussed including more whole grains like whole wheat bread which he reports liking, potentially making his own brekafast sandwiches which he can freeze in bulk to have on hand, limiting OJ <2x/day, including a variety of vegetables, and potentially having vegetables with dip like homemade tzatziki dip to throw into his rotation. Discussed heart healthy eating.      Intervention Plan   Intervention Prescribe, educate and counsel regarding individualized specific dietary modifications aiming towards targeted core components such as weight, hypertension, lipid management, diabetes, heart failure and other comorbidities.;Nutrition  handout(s) given to patient.    Expected Outcomes Short Term Goal: Understand basic principles of dietary content, such as calories, fat, sodium, cholesterol and nutrients.;Short Term Goal: A plan has been developed with personal nutrition goals set during dietitian appointment.;Long Term Goal: Adherence to prescribed nutrition  plan.              Goals reviewed with patient; copy given to patient.

## 2021-07-01 ENCOUNTER — Ambulatory Visit: Payer: BC Managed Care – PPO | Admitting: Cardiology

## 2021-07-26 ENCOUNTER — Ambulatory Visit (INDEPENDENT_AMBULATORY_CARE_PROVIDER_SITE_OTHER): Payer: BC Managed Care – PPO | Admitting: Cardiology

## 2021-07-26 ENCOUNTER — Other Ambulatory Visit: Payer: Self-pay

## 2021-07-26 ENCOUNTER — Encounter: Payer: Self-pay | Admitting: Cardiology

## 2021-07-26 VITALS — BP 110/66 | HR 60 | Ht 69.0 in | Wt 173.0 lb

## 2021-07-26 DIAGNOSIS — I2510 Atherosclerotic heart disease of native coronary artery without angina pectoris: Secondary | ICD-10-CM

## 2021-07-26 DIAGNOSIS — E785 Hyperlipidemia, unspecified: Secondary | ICD-10-CM

## 2021-07-26 NOTE — Progress Notes (Signed)
Cardiology Office Note:    Date:  07/26/2021   ID:  John Christensen, DOB [birth year], MRN [id number]  PCP:  Olin Hauser, DO   [facility] Cardiologist:  Kate Sable, MD     Referring MD: Nobie Putnam *   Chief Complaint  Patient presents with   Other    2 month follow up post ZIO - Meds reviewed verbally with patient.      History of Present Illness:    John Christensen is a 51 y.o. male with history of CAD (s/p PCI to proximal LAD 01/2021),  presents for follow-up.  Previously seen by myself with symptoms of chest pain, underwent coronary CTA showing significant proximal LAD disease.  Subsequently had left heart cath and stent placement to proximal LAD.  Seen in the clinic about 2 months ago, had symptoms of palpitations.  Cardiac monitor was placed to evaluate any significant arrhythmias.  She states drinking lots of coffee at the time, palpitations/skipped heartbeats improved with decreasing caffeine intake.  He has noticed easy bruising of his skin.  Currently takes aspirin and Plavix.  He otherwise feels well, denies chest pain or shortness of breath with activity.  Prior notes LHC 01/2021.  90% proximal LAD, no significant left circumflex or RCA disease.  S/p drug-eluting stent to proximal LAD. Echo 01/2021 EF 55 to 60% Cardiac monitor 04/2021, paroxysmal SVT, rare PACs and PVCs.  Patient triggered events associated with PACs.   no significant past medical history who presents for follow-up.  He was last seen due to chest pain consistent with angina.  His work involves a lot of hiking causing chest discomfort.  Underwent coronary CTA which showed significant proximal LAD disease.  He now presents for follow-up.  States still having symptoms of chest pain with exertion.   Past Medical History:  Diagnosis Date   Cardiac dysrhythmia, unspecified    Other malaise and fatigue    Unspecified congenital anomaly of heart     Past Surgical  History:  Procedure Laterality Date   COLONOSCOPY WITH PROPOFOL N/A 01/30/2020   Procedure: COLONOSCOPY WITH PROPOFOL;  Surgeon: Lin Landsman, MD;  Location: [facility] ENDOSCOPY;  Service: Gastroenterology;  Laterality: N/A;   CORONARY STENT INTERVENTION N/A 02/12/2021   Procedure: CORONARY STENT INTERVENTION;  Surgeon: Nelva Bush, MD;  Location: [facility];  Service: Cardiovascular;  Laterality: N/A;   LEFT HEART CATH AND CORONARY ANGIOGRAPHY Left 02/12/2021   Procedure: LEFT HEART CATH AND CORONARY ANGIOGRAPHY;  Surgeon: Nelva Bush, MD;  Location: [facility];  Service: Cardiovascular;  Laterality: Left;    Current Medications: Current Meds  Medication Sig   acetaminophen (TYLENOL) 500 MG tablet Take 500-1,000 mg by mouth every 6 (six) hours as needed for moderate pain.   albuterol (VENTOLIN HFA) 108 (90 Base) MCG/ACT inhaler Inhale 2 puffs into the lungs every 6 (six) hours as needed for wheezing or shortness of breath.   aspirin EC 81 MG tablet Take 1 tablet (81 mg total) by mouth daily. Swallow whole.   atorvastatin (LIPITOR) 40 MG tablet Take 1 tablet (40 mg total) by mouth daily.   clopidogrel (PLAVIX) 75 MG tablet Take 1 tablet (75 mg total) by mouth daily with breakfast.   nitroGLYCERIN (NITROSTAT) 0.4 MG SL tablet Place 1 tablet (0.4 mg total) under the tongue every 5 (five) minutes as needed for chest pain. For a maximum of 3 doses     Allergies:   Patient has no known allergies.  Social History   Socioeconomic History   Marital status: Married    Spouse name: Not on file   Number of children: Not on file   Years of education: Not on file   Highest education level: Not on file  Occupational History   Not on file  Tobacco Use   Smoking status: Never   Smokeless tobacco: Never  Vaping Use   Vaping Use: Never used  Substance and Sexual Activity   Alcohol use: No   Drug use: No   Sexual activity: Not on file  Other Topics Concern   Not  on file  Social History Narrative   Not on file   Social Determinants of Health   Financial Resource Strain: Not on file  Food Insecurity: Not on file  Transportation Needs: Not on file  Physical Activity: Not on file  Stress: Not on file  Social Connections: Not on file     Family History: The patient's family history includes Hyperlipidemia in his father and mother. There is no history of Colon cancer or Prostate cancer.  ROS:   Please see the history of present illness.     All other systems reviewed and are negative.  EKGs/Labs/Other Studies Reviewed:    The following studies were reviewed today:   EKG:  EKG not ordered today.   Recent Labs: 02/13/2021: BUN 31; Creatinine, Ser 1.06; Hemoglobin 14.3; Platelets 251; Potassium 4.3; Sodium 139 04/29/2021: ALT 26  Recent Lipid Panel    Component Value Date/Time   CHOL 134 04/29/2021 1439   TRIG 56 04/29/2021 1439   HDL 55 04/29/2021 1439   CHOLHDL 2.4 04/29/2021 1439   CHOLHDL 3.3 12/31/2020 0833   VLDL 9 09/09/2016 0922   LDLCALC 67 04/29/2021 1439   LDLCALC 111 (H) 12/31/2020 0833   LDLDIRECT 65 04/29/2021 1439     Risk Assessment/Calculations:          Physical Exam:    VS:  BP 110/66 (BP Location: Left Arm, Patient Position: Sitting, Cuff Size: Normal)    Pulse 60    Ht 5\' 9"  (1.753 m)    Wt 173 lb (78.5 kg)    SpO2 98%    BMI 25.55 kg/m     Wt Readings from Last 3 Encounters:  07/26/21 173 lb (78.5 kg)  05/27/21 178 lb 11.2 oz (81.1 kg)  04/29/21 176 lb (79.8 kg)     GEN:  Well nourished, well developed in no acute distress HEENT: Normal NECK: No JVD; No carotid bruits LYMPHATICS: No lymphadenopathy CARDIAC: RRR, no murmurs, rubs, gallops RESPIRATORY:  Clear to auscultation without rales, wheezing or rhonchi  ABDOMEN: Soft, non-tender, non-distended MUSCULOSKELETAL:  No edema; No deformity  SKIN: Warm and dry NEUROLOGIC:  Alert and oriented x 3 PSYCHIATRIC:  Normal affect   ASSESSMENT:     1. Coronary artery disease involving native coronary artery of native heart without angina pectoris   2. Hyperlipidemia LDL goal <70      PLAN:    In order of problems listed above:  CAD s/p DES to proximal LAD 01/2021.  Denies chest pain.  Continue aspirin, Plavix x6 months from stent placement.  Continue with Plavix only after.  Lipitor. Hyperlipidemia, LDL at goal.  Continue Lipitor 40 mg daily.  Follow-up in 6 months.   Medication Adjustments/Labs and Tests Ordered: Current medicines are reviewed at length with the patient today.  Concerns regarding medicines are outlined above.  No orders of the defined types were placed in this encounter.  No orders of the defined types were placed in this encounter.     Patient Instructions  Medication Instructions:   STOP taking Aspirin in 2 Months (June 10th)  *If you need a refill on your cardiac medications before your next appointment, please call your pharmacy*   Lab Work: None ordered If you have labs (blood work) drawn today and your tests are completely normal, you will receive your results only by: [place] (if you have MyChart) OR A paper copy in the mail If you have any lab test that is abnormal or we need to change your treatment, we will call you to review the results.   Testing/Procedures: None ordered   Follow-Up: At [facility], you and your health needs are our priority.  As part of our continuing mission to provide you with exceptional heart care, we have created designated Provider Care Teams.  These Care Teams include your primary Cardiologist (physician) and Advanced Practice Providers (APPs -  Physician Assistants and Nurse Practitioners) who all work together to provide you with the care you need, when you need it.  We recommend signing up for the patient portal called "MyChart".  Sign up information is provided on this After Visit Summary.  MyChart is used to connect with patients for  Virtual Visits (Telemedicine).  Patients are able to view lab/test results, encounter notes, upcoming appointments, etc.  Non-urgent messages can be sent to your provider as well.   To learn more about what you can do with MyChart, go to [email or web address].    Your next appointment:   6 month(s)  The format for your next appointment:   In Person  Provider:   You may see Kate Sable, MD or one of the following Advanced Practice Providers on your designated Care Team:   Murray Hodgkins, NP Christell Faith, PA-C Cadence Kathlen Mody, [place]    Other Instructions     Signed, Kate Sable, MD  07/26/2021 5:00 PM    [place]

## 2021-07-26 NOTE — Patient Instructions (Signed)
Medication Instructions:  ? ?STOP taking Aspirin in 2 Months (June 10th) ? ?*If you need a refill on your cardiac medications before your next appointment, please call your pharmacy* ? ? ?Lab Work: ?None ordered ?If you have labs (blood work) drawn today and your tests are completely normal, you will receive your results only by: ?MyChart Message (if you have MyChart) OR ?A paper copy in the mail ?If you have any lab test that is abnormal or we need to change your treatment, we will call you to review the results. ? ? ?Testing/Procedures: ?None ordered ? ? ?Follow-Up: ?At [facility], you and your health needs are our priority.  As part of our continuing mission to provide you with exceptional heart care, we have created designated Provider Care Teams.  These Care Teams include your primary Cardiologist (physician) and Advanced Practice Providers (APPs -  Physician Assistants and Nurse Practitioners) who all work together to provide you with the care you need, when you need it. ? ?We recommend signing up for the patient portal called "MyChart".  Sign up information is provided on this After Visit Summary.  MyChart is used to connect with patients for Virtual Visits (Telemedicine).  Patients are able to view lab/test results, encounter notes, upcoming appointments, etc.  Non-urgent messages can be sent to your provider as well.   ?To learn more about what you can do with MyChart, go to [email or web address].   ? ?Your next appointment:   ?6 month(s) ? ?The format for your next appointment:   ?In Person ? ?Provider:   ?You may see Debbe Odea, MD or one of the following Advanced Practice Providers on your designated Care Team:   ?Nicolasa Ducking, NP ?Eula Listen, PA-C ?Cadence Fransico Michael, PA-C  ? ? ?Other Instructions ? ? ?

## 2021-08-08 ENCOUNTER — Other Ambulatory Visit: Payer: Self-pay

## 2021-08-08 MED ORDER — ATORVASTATIN CALCIUM 40 MG PO TABS: 40.0000 mg | ORAL_TABLET | Freq: Every day | ORAL | 4 refills | Status: DC

## 2021-11-11 ENCOUNTER — Other Ambulatory Visit: Payer: Self-pay | Admitting: *Deleted

## 2021-11-11 MED ORDER — ATORVASTATIN CALCIUM 40 MG PO TABS: 40.0000 mg | ORAL_TABLET | Freq: Every day | ORAL | 3 refills | Status: DC

## 2022-01-06 ENCOUNTER — Encounter: Payer: BC Managed Care – PPO | Admitting: Family Medicine

## 2022-01-06 ENCOUNTER — Other Ambulatory Visit: Payer: BC Managed Care – PPO

## 2022-01-26 ENCOUNTER — Encounter: Payer: Self-pay | Admitting: Cardiology

## 2022-01-28 ENCOUNTER — Telehealth: Payer: Self-pay | Admitting: Cardiology

## 2022-01-28 NOTE — Telephone Encounter (Signed)
   Pre-operative Risk Assessment    Patient Name: John Christensen  DOB: 1970/10/01 MRN: [id number]      Request for Surgical Clearance    Procedure:   Cleaning - prophy and exam - no extractions   Date of Surgery:  Clearance TBD                                 Surgeon:  not indicated Surgeon's Group or Practice Name:  [facility] Phone number:  [phone] Fax number:  [phone]   Type of Clearance Requested:   - Medical    Type of Anesthesia:  not indicated   Additional requests/questions:    Courtney Heys   01/28/2022, 10:56 AM

## 2022-01-28 NOTE — Telephone Encounter (Signed)
   Primary Cardiologist: Debbe Odea, MD  Chart reviewed as part of pre-operative protocol coverage. Simple dental extractions are considered low risk procedures per guidelines and generally do not require any specific cardiac clearance. It is also generally accepted that for simple extractions and dental cleanings, there is no need to interrupt blood thinner therapy.   SBE prophylaxis is not required for the patient.  I will route this recommendation to the requesting party via Epic fax function and remove from pre-op pool.  Please call with questions.  Levi Aland, NP-C     01/28/2022, 12:54 PM [address] Office [phone] Fax [phone]

## 2022-01-29 ENCOUNTER — Other Ambulatory Visit: Payer: Self-pay | Admitting: Family Medicine

## 2022-01-29 ENCOUNTER — Other Ambulatory Visit: Payer: BC Managed Care – PPO

## 2022-01-29 ENCOUNTER — Ambulatory Visit (INDEPENDENT_AMBULATORY_CARE_PROVIDER_SITE_OTHER): Payer: BC Managed Care – PPO | Admitting: Family Medicine

## 2022-01-29 ENCOUNTER — Encounter: Payer: Self-pay | Admitting: Family Medicine

## 2022-01-29 VITALS — BP 113/63 | HR 61 | Ht 69.0 in | Wt 171.6 lb

## 2022-01-29 DIAGNOSIS — E785 Hyperlipidemia, unspecified: Secondary | ICD-10-CM | POA: Diagnosis not present

## 2022-01-29 DIAGNOSIS — I2510 Atherosclerotic heart disease of native coronary artery without angina pectoris: Secondary | ICD-10-CM | POA: Diagnosis not present

## 2022-01-29 DIAGNOSIS — R350 Frequency of micturition: Secondary | ICD-10-CM

## 2022-01-29 DIAGNOSIS — Z0000 Encounter for general adult medical examination without abnormal findings: Secondary | ICD-10-CM

## 2022-01-29 DIAGNOSIS — B351 Tinea unguium: Secondary | ICD-10-CM

## 2022-01-29 DIAGNOSIS — Z131 Encounter for screening for diabetes mellitus: Secondary | ICD-10-CM | POA: Diagnosis not present

## 2022-01-29 DIAGNOSIS — Z1283 Encounter for screening for malignant neoplasm of skin: Secondary | ICD-10-CM

## 2022-01-29 DIAGNOSIS — Z125 Encounter for screening for malignant neoplasm of prostate: Secondary | ICD-10-CM | POA: Diagnosis not present

## 2022-01-29 MED ORDER — TERBINAFINE HCL 250 MG PO TABS: 250.0000 mg | ORAL_TABLET | Freq: Every day | ORAL | 2 refills | Status: DC

## 2022-01-29 NOTE — Patient Instructions (Addendum)
Thank you for coming to the office today.  Labs today Stay tuned for results Will follow up with evaluation from Cardiology on Friday. Vitals / Weight all in excellent range Continue current therapy  Start Terbinafine 250mg  daily for toenail fungus, it is a slow course, let me know if questions.  Consider future Xarelto low dose with aspirin for heart protection, may discuss with Cardiology in the future.  Please schedule a Follow-up Appointment to: Return in about 1 year (around 01/30/2023) for 1 year Annual Physical AM apt fasting lab after.  If you have any other questions or concerns, please feel free to call the office or send a message through MyChart. You may also schedule an earlier appointment if necessary.  Additionally, you may be receiving a survey about your experience at our office within a few days to 1 week by e-mail or mail. We value your feedback.  02/01/2023, DO [facility], [facility]

## 2022-01-29 NOTE — Progress Notes (Signed)
Subjective:    Patient ID: John Christensen, male    DOB: July 27, 1970, 51 y.o.   MRN: [id number]  John Christensen is a 51 y.o. male presenting on 01/29/2022 for Annual Exam   HPI  Here for Annual Physical and Due for lab.   Lifestyle BMI >25 Reviewed healthy lifestyle and diet / exercise goals He is doing well overall He has completed Cardiac Rehab last year. Not on medications or supplements   CAD, s/p PCI Stent History of Episodic Dyspnea / Chest Discomfort Hyperlipidemia Last year had concerns with recurrent symptoms, he established with Cardiology and they did labs, they did preliminary screenings, progressed to CT Cardiac scoring, then proceeded to Cardiac Cath and found 90% LAD blockage, and placed 1 stent. - He was treated with DAPT ASA + Plavix for 6 months, then switched to Plavix monotherapy, off Aspirin due to bruising and Cardiology recommendation. He has continued on Lipitor cholesterol rx as well. - Has upcoming visit w Cardiology this week 01/31/22  -----------------  Urinary Frequency  AUA BPH Symptom Score over past 1 month 1. Sensation of not emptying bladder post void - 0 2. Urinate less than 2 hour after finish last void - 2 3. Start/Stop several times during void - 0 4. Difficult to postpone urination - 2 5. Weak urinary stream - 0 6. Push or strain urination - 0 7. Nocturia - 0 times  Total Score: 3-4 (Mild BPH symptoms)    Health Maintenance:   COVID19 initial vaccine series.    Prostate CA Screening: Last PSA negative x 2 last 2022. Currently very mild frequency see above. No known family history of prostate CA. Due for screening.   Colon CA Screening: Last colonoscopy 01/30/20 Dr Allegra Lai no polyps, repeat 10 years approx 2031. Currently asymptomatic. No known family history of colon CA.      01/29/2022    8:47 AM 05/29/2021    4:53 PM 03/05/2021    9:25 AM  Depression screen PHQ 2/9  Decreased Interest 0 0 0  Down, Depressed, Hopeless 0 0 0   PHQ - 2 Score 0 0 0  Altered sleeping 0 0 0  Tired, decreased energy 0 0 0  Change in appetite 0 0 0  Feeling bad or failure about yourself  0 0 0  Trouble concentrating 0 0 0  Moving slowly or fidgety/restless 0 0 0  Suicidal thoughts 0 0 0  PHQ-9 Score 0 0 0  Difficult doing work/chores Not difficult at all Not difficult at all Not difficult at all    Past Medical History:  Diagnosis Date   Cardiac dysrhythmia, unspecified    Other malaise and fatigue    Unspecified congenital anomaly of heart    Past Surgical History:  Procedure Laterality Date   COLONOSCOPY WITH PROPOFOL N/A 01/30/2020   Procedure: COLONOSCOPY WITH PROPOFOL;  Surgeon: Toney Reil, MD;  Location: [facility] ENDOSCOPY;  Service: Gastroenterology;  Laterality: N/A;   CORONARY STENT INTERVENTION N/A 02/12/2021   Procedure: CORONARY STENT INTERVENTION;  Surgeon: Yvonne Kendall, MD;  Location: [facility] INVASIVE CV LAB;  Service: Cardiovascular;  Laterality: N/A;   LEFT HEART CATH AND CORONARY ANGIOGRAPHY Left 02/12/2021   Procedure: LEFT HEART CATH AND CORONARY ANGIOGRAPHY;  Surgeon: Yvonne Kendall, MD;  Location: [facility] INVASIVE CV LAB;  Service: Cardiovascular;  Laterality: Left;   Social History   Socioeconomic History   Marital status: Married    Spouse name: Not on file   Number of children: Not  on file   Years of education: Not on file   Highest education level: Not on file  Occupational History   Not on file  Tobacco Use   Smoking status: Never   Smokeless tobacco: Never  Vaping Use   Vaping Use: Never used  Substance and Sexual Activity   Alcohol use: No   Drug use: No   Sexual activity: Not on file  Other Topics Concern   Not on file  Social History Narrative   Not on file   Social Determinants of Health   Financial Resource Strain: Not on file  Food Insecurity: Not on file  Transportation Needs: Not on file  Physical Activity: Not on file  Stress: Not on file  Social Connections: Not  on file  Intimate Partner Violence: Not on file   Family History  Problem Relation Age of Onset   Hyperlipidemia Mother    Hyperlipidemia Father    Colon cancer Neg Hx    Prostate cancer Neg Hx    Current Outpatient Medications on File Prior to Visit  Medication Sig   acetaminophen (TYLENOL) 500 MG tablet Take 500-1,000 mg by mouth every 6 (six) hours as needed for moderate pain.   albuterol (VENTOLIN HFA) 108 (90 Base) MCG/ACT inhaler Inhale 2 puffs into the lungs every 6 (six) hours as needed for wheezing or shortness of breath.   atorvastatin (LIPITOR) 40 MG tablet Take 1 tablet (40 mg total) by mouth daily.   clopidogrel (PLAVIX) 75 MG tablet Take 1 tablet (75 mg total) by mouth daily with breakfast.   nitroGLYCERIN (NITROSTAT) 0.4 MG SL tablet Place 1 tablet (0.4 mg total) under the tongue every 5 (five) minutes as needed for chest pain. For a maximum of 3 doses   No current facility-administered medications on file prior to visit.    Review of Systems  Constitutional:  Negative for activity change, appetite change, chills, diaphoresis, fatigue and fever.  HENT:  Negative for congestion and hearing loss.   Eyes:  Negative for visual disturbance.  Respiratory:  Negative for cough, chest tightness, shortness of breath and wheezing.   Cardiovascular:  Negative for chest pain, palpitations and leg swelling.  Gastrointestinal:  Negative for abdominal pain, constipation, diarrhea, nausea and vomiting.  Genitourinary:  Negative for dysuria, frequency and hematuria.  Musculoskeletal:  Negative for arthralgias and neck pain.  Skin:  Negative for rash.  Neurological:  Negative for dizziness, weakness, light-headedness, numbness and headaches.  Hematological:  Negative for adenopathy.  Psychiatric/Behavioral:  Negative for behavioral problems, dysphoric mood and sleep disturbance.    Per HPI unless specifically indicated above      Objective:    BP 113/63   Pulse 61   Ht 5\' 9"   (1.753 m)   Wt 171 lb 9.6 oz (77.8 kg)   SpO2 100%   BMI 25.34 kg/m   Wt Readings from Last 3 Encounters:  01/29/22 171 lb 9.6 oz (77.8 kg)  07/26/21 173 lb (78.5 kg)  05/27/21 178 lb 11.2 oz (81.1 kg)    Physical Exam Vitals and nursing note reviewed.  Constitutional:      General: He is not in acute distress.    Appearance: He is well-developed. He is not diaphoretic.     Comments: Well-appearing, comfortable, cooperative  HENT:     Head: Normocephalic and atraumatic.  Eyes:     General:        Right eye: No discharge.        Left eye: No  discharge.     Conjunctiva/sclera: Conjunctivae normal.     Pupils: Pupils are equal, round, and reactive to light.  Neck:     Thyroid: No thyromegaly.  Cardiovascular:     Rate and Rhythm: Normal rate and regular rhythm.     Pulses: Normal pulses.     Heart sounds: Normal heart sounds. No murmur heard. Pulmonary:     Effort: Pulmonary effort is normal. No respiratory distress.     Breath sounds: Normal breath sounds. No wheezing or rales.  Abdominal:     General: Bowel sounds are normal. There is no distension.     Palpations: Abdomen is soft. There is no mass.     Tenderness: There is no abdominal tenderness.  Musculoskeletal:        General: No tenderness. Normal range of motion.     Cervical back: Normal range of motion and neck supple.     Comments: Upper / Lower Extremities: - Normal muscle tone, strength bilateral upper extremities 5/5, lower extremities 5/5  Lymphadenopathy:     Cervical: No cervical adenopathy.  Skin:    General: Skin is warm and dry.     Findings: Lesion (thickened discolored nails onychomycosis) present. No erythema or rash.  Neurological:     Mental Status: He is alert and oriented to person, place, and time.     Comments: Distal sensation intact to light touch all extremities  Psychiatric:        Mood and Affect: Mood normal.        Behavior: Behavior normal.        Thought Content: Thought content  normal.     Comments: Well groomed, good eye contact, normal speech and thoughts    Results for orders placed or performed in visit on 04/29/21  Hepatic function panel  Result Value Ref Range   Total Protein 7.3 6.0 - 8.5 g/dL   Albumin 4.6 3.8 - 4.9 g/dL   Bilirubin Total 0.7 0.0 - 1.2 mg/dL   Bilirubin, Direct 9.79 0.00 - 0.40 mg/dL   Alkaline Phosphatase 66 44 - 121 IU/L   AST 20 0 - 40 IU/L   ALT 26 0 - 44 IU/L  Lipid panel  Result Value Ref Range   Cholesterol, Total 134 100 - 199 mg/dL   Triglycerides 56 0 - 149 mg/dL   HDL 55 >48 mg/dL   VLDL Cholesterol Cal 12 5 - 40 mg/dL   LDL Chol Calc (NIH) 67 0 - 99 mg/dL   Chol/HDL Ratio 2.4 0.0 - 5.0 ratio  Direct LDL  Result Value Ref Range   LDL Direct 65 0 - 99 mg/dL      Assessment & Plan:   Problem List Items Addressed This Visit     CAD in native artery   Relevant Orders   COMPLETE METABOLIC PANEL WITH GFR   CBC with Differential/Platelet   Lipid panel   TSH   Hyperlipidemia LDL goal <70   Relevant Orders   COMPLETE METABOLIC PANEL WITH GFR   Lipid panel   TSH   Other Visit Diagnoses     Annual physical exam    -  Primary   Screening for prostate cancer       Relevant Orders   PSA   Screening for diabetes mellitus (DM)       Relevant Orders   Hemoglobin A1c   Urinary frequency       Relevant Orders   Urinalysis, Routine w reflex microscopic  Onychomycosis of great toe       Relevant Medications   terbinafine (LAMISIL) 250 MG tablet       Updated Health Maintenance information Fasting labs ordered today, pending result review Encouraged improvement to lifestyle with diet and exercise Goal of weight loss  #Onychomycosis great toes Failed topical therapy OTC Trial on oral Terbinafine 250mg  daily 3 months, may consider repeat lab for LFT after completed course if need any further treatment.  #Urinary Frequency mild LUTS No significant BPH concerns at this time. PSA negative, will repeat  now Mild symptoms, will monitor for now Lifestyle modification No medication needed  CAD S/p Stent Followed by Cardiology Continue med management Discussed future consideration of switch plavix to ASA + Xarelto low dose for CAD indication if interested and can review with cardiology  Orders Placed This Encounter  Procedures   COMPLETE METABOLIC PANEL WITH GFR   CBC with Differential/Platelet   Lipid panel    Order Specific Question:   Has the patient fasted?    Answer:   Yes   Hemoglobin A1c   PSA   TSH   Urinalysis, Routine w reflex microscopic      Meds ordered this encounter  Medications   terbinafine (LAMISIL) 250 MG tablet    Sig: Take 1 tablet (250 mg total) by mouth daily. For total of 3 months for onychomycosis toenail    Dispense:  30 tablet    Refill:  2    Follow up plan: Return in about 1 year (around 01/30/2023) for 1 year Annual Physical AM apt fasting lab after.    02/01/2023, DO [facility] 01/29/2022, 8:37 AM

## 2022-01-30 LAB — COMPLETE METABOLIC PANEL WITH GFR
AG Ratio: 1.7 (calc) (ref 1.0–2.5)
ALT: 26 U/L (ref 9–46)
AST: 20 U/L (ref 10–35)
Albumin: 4.5 g/dL (ref 3.6–5.1)
Alkaline phosphatase (APISO): 56 U/L (ref 35–144)
BUN: 19 mg/dL (ref 7–25)
CO2: 26 mmol/L (ref 20–32)
Calcium: 9.7 mg/dL (ref 8.6–10.3)
Chloride: 106 mmol/L (ref 98–110)
Creat: 0.96 mg/dL (ref 0.70–1.30)
Globulin: 2.6 g/dL (calc) (ref 1.9–3.7)
Glucose, Bld: 93 mg/dL (ref 65–99)
Potassium: 4.2 mmol/L (ref 3.5–5.3)
Sodium: 141 mmol/L (ref 135–146)
Total Bilirubin: 1 mg/dL (ref 0.2–1.2)
Total Protein: 7.1 g/dL (ref 6.1–8.1)
eGFR: 95 mL/min/{1.73_m2} (ref >=60)

## 2022-01-30 LAB — URINALYSIS, ROUTINE W REFLEX MICROSCOPIC
Bacteria, UA: NONE SEEN /HPF
Bilirubin Urine: NEGATIVE
Glucose, UA: NEGATIVE
Hgb urine dipstick: NEGATIVE
Hyaline Cast: NONE SEEN /LPF
Ketones, ur: NEGATIVE
Leukocytes,Ua: NEGATIVE
Nitrite: NEGATIVE
Protein, ur: NEGATIVE
RBC / HPF: NONE SEEN /HPF (ref 0–2)
Specific Gravity, Urine: 1.02 (ref 1.001–1.035)
Squamous Epithelial / HPF: NONE SEEN /HPF (ref <=5)
pH: 6 (ref 5.0–8.0)

## 2022-01-30 LAB — CBC WITH DIFFERENTIAL/PLATELET
Absolute Monocytes: 485 cells/uL (ref 200–950)
Basophils Absolute: 39 cells/uL (ref 0–200)
Basophils Relative: 0.8 %
Eosinophils Absolute: 59 cells/uL (ref 15–500)
Eosinophils Relative: 1.2 %
HCT: 44.8 % (ref 38.5–50.0)
Hemoglobin: 15.3 g/dL (ref 13.2–17.1)
Lymphs Abs: 1431 cells/uL (ref 850–3900)
MCH: 29.9 pg (ref 27.0–33.0)
MCHC: 34.2 g/dL (ref 32.0–36.0)
MCV: 87.7 fL (ref 80.0–100.0)
MPV: 9.4 fL (ref 7.5–12.5)
Monocytes Relative: 9.9 %
Neutro Abs: 2886 cells/uL (ref 1500–7800)
Neutrophils Relative %: 58.9 %
Platelets: 280 10*3/uL (ref 140–400)
RBC: 5.11 10*6/uL (ref 4.20–5.80)
RDW: 12.2 % (ref 11.0–15.0)
Total Lymphocyte: 29.2 %
WBC: 4.9 10*3/uL (ref 3.8–10.8)

## 2022-01-30 LAB — HEMOGLOBIN A1C
Hgb A1c MFr Bld: 4.8 % of total Hgb (ref <5.7)
Mean Plasma Glucose: 91 mg/dL
eAG (mmol/L): 5 mmol/L

## 2022-01-30 LAB — PSA: PSA: 0.44 ng/mL (ref <=4.00)

## 2022-01-30 LAB — LIPID PANEL
Cholesterol: 135 mg/dL (ref <200)
HDL: 56 mg/dL (ref >=40)
LDL Cholesterol (Calc): 65 mg/dL (calc)
Non-HDL Cholesterol (Calc): 79 mg/dL (calc) (ref <130)
Total CHOL/HDL Ratio: 2.4 (calc) (ref <5.0)
Triglycerides: 49 mg/dL (ref <150)

## 2022-01-30 LAB — TSH: TSH: 1.28 mIU/L (ref 0.40–4.50)

## 2022-01-31 ENCOUNTER — Encounter: Payer: Self-pay | Admitting: Cardiology

## 2022-01-31 ENCOUNTER — Ambulatory Visit: Payer: BC Managed Care – PPO | Attending: Cardiology | Admitting: Cardiology

## 2022-01-31 VITALS — BP 104/70 | HR 57 | Ht 68.0 in | Wt 169.0 lb

## 2022-01-31 DIAGNOSIS — I2510 Atherosclerotic heart disease of native coronary artery without angina pectoris: Secondary | ICD-10-CM

## 2022-01-31 DIAGNOSIS — E785 Hyperlipidemia, unspecified: Secondary | ICD-10-CM

## 2022-01-31 NOTE — Patient Instructions (Signed)
Medication Instructions:   Your physician recommends that you continue on your current medications as directed. Please refer to the Current Medication list given to you today.  *If you need a refill on your cardiac medications before your next appointment, please call your pharmacy*   Follow-Up: At [facility], you and your health needs are our priority.  As part of our continuing mission to provide you with exceptional heart care, we have created designated Provider Care Teams.  These Care Teams include your primary Cardiologist (physician) and Advanced Practice Providers (APPs -  Physician Assistants and Nurse Practitioners) who all work together to provide you with the care you need, when you need it.  We recommend signing up for the patient portal called "MyChart".  Sign up information is provided on this After Visit Summary.  MyChart is used to connect with patients for Virtual Visits (Telemedicine).  Patients are able to view lab/test results, encounter notes, upcoming appointments, etc.  Non-urgent messages can be sent to your provider as well.   To learn more about what you can do with MyChart, go to [email or web address].    Your next appointment:   1 year(s)  The format for your next appointment:   In Person  Provider:   Brian Agbor-Etang, MD    Other Instructions   Important Information About Sugar       

## 2022-01-31 NOTE — Progress Notes (Signed)
Cardiology Office Note:    Date:  01/31/2022   ID:  John Christensen, DOB 1970-12-02, MRN [id number]  PCP:  Smitty Cords, DO   [facility] Providers Cardiologist:  Debbe Odea, MD     Referring MD: Saralyn Pilar *   Chief Complaint  Patient presents with   Other    6 month follow up -- Meds reviewed verbally with patient.     History of Present Illness:    John Christensen is a 51 y.o. male with history of CAD (s/p PCI to proximal LAD 01/2021),  presents for follow-up.  Being seen due to CAD, underwent stent placement to proximal LAD.  Dual antiplatelets x6 months.  Aspirin was stopped, currently takes Plavix, Lipitor.  Denies chest pain, feels well, has no concerns at this time.  Cholesterol adequately controlled.   Prior notes LHC 01/2021.  90% proximal LAD, no significant left circumflex or RCA disease.  S/p drug-eluting stent to proximal LAD. Echo 01/2021 EF 55 to 60% Cardiac monitor 04/2021, paroxysmal SVT, rare PACs and PVCs.  Patient triggered events associated with PACs.   Past Medical History:  Diagnosis Date   Cardiac dysrhythmia, unspecified    Other malaise and fatigue    Unspecified congenital anomaly of heart     Past Surgical History:  Procedure Laterality Date   COLONOSCOPY WITH PROPOFOL N/A 01/30/2020   Procedure: COLONOSCOPY WITH PROPOFOL;  Surgeon: Toney Reil, MD;  Location: [facility] ENDOSCOPY;  Service: Gastroenterology;  Laterality: N/A;   CORONARY STENT INTERVENTION N/A 02/12/2021   Procedure: CORONARY STENT INTERVENTION;  Surgeon: Yvonne Kendall, MD;  Location: [facility] INVASIVE CV LAB;  Service: Cardiovascular;  Laterality: N/A;   LEFT HEART CATH AND CORONARY ANGIOGRAPHY Left 02/12/2021   Procedure: LEFT HEART CATH AND CORONARY ANGIOGRAPHY;  Surgeon: Yvonne Kendall, MD;  Location: [facility] INVASIVE CV LAB;  Service: Cardiovascular;  Laterality: Left;    Current Medications: Current Meds  Medication Sig   acetaminophen  (TYLENOL) 500 MG tablet Take 500-1,000 mg by mouth every 6 (six) hours as needed for moderate pain.   albuterol (VENTOLIN HFA) 108 (90 Base) MCG/ACT inhaler Inhale 2 puffs into the lungs every 6 (six) hours as needed for wheezing or shortness of breath.   atorvastatin (LIPITOR) 40 MG tablet Take 1 tablet (40 mg total) by mouth daily.   clopidogrel (PLAVIX) 75 MG tablet Take 1 tablet (75 mg total) by mouth daily with breakfast.   nitroGLYCERIN (NITROSTAT) 0.4 MG SL tablet Place 1 tablet (0.4 mg total) under the tongue every 5 (five) minutes as needed for chest pain. For a maximum of 3 doses   terbinafine (LAMISIL) 250 MG tablet Take 1 tablet (250 mg total) by mouth daily. For total of 3 months for onychomycosis toenail     Allergies:   Patient has no known allergies.   Social History   Socioeconomic History   Marital status: Married    Spouse name: Not on file   Number of children: Not on file   Years of education: Not on file   Highest education level: Not on file  Occupational History   Not on file  Tobacco Use   Smoking status: Never   Smokeless tobacco: Never  Vaping Use   Vaping Use: Never used  Substance and Sexual Activity   Alcohol use: No   Drug use: No   Sexual activity: Not on file  Other Topics Concern   Not on file  Social History Narrative   Not on  file   Social Determinants of Health   Financial Resource Strain: Not on file  Food Insecurity: Not on file  Transportation Needs: Not on file  Physical Activity: Not on file  Stress: Not on file  Social Connections: Not on file     Family History: The patient's family history includes Hyperlipidemia in his father and mother. There is no history of Colon cancer or Prostate cancer.  ROS:   Please see the history of present illness.     All other systems reviewed and are negative.  EKGs/Labs/Other Studies Reviewed:    The following studies were reviewed today:   EKG:  EKG is ordered today.  EKG shows sinus  bradycardia, otherwise normal ECG  Recent Labs: 01/29/2022: ALT 26; BUN 19; Creat 0.96; Hemoglobin 15.3; Platelets 280; Potassium 4.2; Sodium 141; TSH 1.28  Recent Lipid Panel    Component Value Date/Time   CHOL 135 01/29/2022 0905   CHOL 134 04/29/2021 1439   TRIG 49 01/29/2022 0905   HDL 56 01/29/2022 0905   HDL 55 04/29/2021 1439   CHOLHDL 2.4 01/29/2022 0905   VLDL 9 09/09/2016 0922   LDLCALC 65 01/29/2022 0905   LDLDIRECT 65 04/29/2021 1439     Risk Assessment/Calculations:          Physical Exam:    VS:  BP 104/70 (BP Location: Left Arm, Patient Position: Sitting, Cuff Size: Normal)   Pulse (!) 57   Ht 5\' 8"  (1.727 m)   Wt 169 lb (76.7 kg)   BMI 25.70 kg/m     Wt Readings from Last 3 Encounters:  01/31/22 169 lb (76.7 kg)  01/29/22 171 lb 9.6 oz (77.8 kg)  07/26/21 173 lb (78.5 kg)     GEN:  Well nourished, well developed in no acute distress HEENT: Normal NECK: No JVD; No carotid bruits CARDIAC: RRR, no murmurs, rubs, gallops RESPIRATORY:  Clear to auscultation without rales, wheezing or rhonchi  ABDOMEN: Soft, non-tender, non-distended MUSCULOSKELETAL:  No edema; No deformity  SKIN: Warm and dry NEUROLOGIC:  Alert and oriented x 3 PSYCHIATRIC:  Normal affect   ASSESSMENT:    1. Coronary artery disease involving native coronary artery of native heart without angina pectoris   2. Hyperlipidemia LDL goal <70      PLAN:    In order of problems listed above:  CAD s/p DES to proximal LAD 01/2021.  Denies chest pain.  Continue Plavix, Lipitor. Hyperlipidemia, LDL at goal.  Continue Lipitor 40 mg daily.  Follow-up in 1 year   Medication Adjustments/Labs and Tests Ordered: Current medicines are reviewed at length with the patient today.  Concerns regarding medicines are outlined above.  Orders Placed This Encounter  Procedures   EKG 12-Lead     No orders of the defined types were placed in this encounter.     Patient Instructions   Medication Instructions:  Your physician recommends that you continue on your current medications as directed. Please refer to the Current Medication list given to you today.  *If you need a refill on your cardiac medications before your next appointment, please call your pharmacy*  Follow-Up: At [facility], you and your health needs are our priority.  As part of our continuing mission to provide you with exceptional heart care, we have created designated Provider Care Teams.  These Care Teams include your primary Cardiologist (physician) and Advanced Practice Providers (APPs -  Physician Assistants and Nurse Practitioners) who all work together to provide you with the  care you need, when you need it.  We recommend signing up for the patient portal called "MyChart".  Sign up information is provided on this After Visit Summary.  MyChart is used to connect with patients for Virtual Visits (Telemedicine).  Patients are able to view lab/test results, encounter notes, upcoming appointments, etc.  Non-urgent messages can be sent to your provider as well.   To learn more about what you can do with MyChart, go to [email or web address].    Your next appointment:   1 year(s)  The format for your next appointment:   In Person  Provider:   Debbe Odea, MD    Other Instructions   Important Information About Sugar         Signed, Debbe Odea, MD  01/31/2022 4:54 PM    [facility]

## 2022-02-05 ENCOUNTER — Other Ambulatory Visit: Payer: Self-pay | Admitting: Physician Assistant

## 2022-02-06 ENCOUNTER — Other Ambulatory Visit: Payer: Self-pay

## 2022-02-06 MED ORDER — ATORVASTATIN CALCIUM 40 MG PO TABS: 40.0000 mg | ORAL_TABLET | Freq: Every day | ORAL | 10 refills | Status: DC

## 2022-06-29 ENCOUNTER — Other Ambulatory Visit: Payer: Self-pay | Admitting: Family Medicine

## 2022-06-29 DIAGNOSIS — B351 Tinea unguium: Secondary | ICD-10-CM

## 2022-06-30 NOTE — Telephone Encounter (Signed)
Requested medication (s) are due for refill today: Yes  Requested medication (s) are on the active medication list: Yes  Last refill:  01/29/22  Future visit scheduled: Yes  Notes to clinic:  See request.    Requested Prescriptions  Pending Prescriptions Disp Refills   terbinafine (LAMISIL) 250 MG tablet [Pharmacy Med Name: TERBINAFINE HCL 250 MG TABLET] 30 tablet 2    Sig: Take 1 tablet (250 mg total) by mouth daily. For total of 3 months for onychomycosis toenail     Off-Protocol Failed - 06/29/2022  3:04 AM      Failed - Medication not assigned to a protocol, review manually.      Passed - Valid encounter within last 12 months    Recent Outpatient Visits           5 months ago Annual physical exam   [facility] [place], Devonne Doughty, DO   1 year ago Annual physical exam   [facility] Olin Hauser, DO   2 years ago Annual physical exam   [facility] Olin Hauser, DO   4 years ago Encounter for annual physical exam   [facility] [place], Jerrel Ivory, NP   5 years ago Encounter for annual physical exam   [facility]ily[facility] Puma, Jerrel Ivory, NP       Future Appointments             In 3 months Ralene Bathe, MD [place]   In 7 months [facility], [facility]

## 2022-09-30 IMAGING — CT CT HEART MORP W/ CTA COR W/ SCORE W/ CA W/CM &/OR W/O CM
2 of 11 series · 7 of 20 positions shown, 8 images · non-contrast
Comparison: 12/12/2020 chest radiograph.

Addendum:
CLINICAL DATA: Chest pain

EXAM:
Cardiac/Coronary  CTA
TECHNIQUE: The patient was scanned on a Siemens Somatom go.Top scanner.

[Series 25: multiphase % cta coronary 0.60 · axial · 0.34mm/px · z∈[-1087,-1008]mm · 4 of 3630 slices shown, 5 images]
[im 726/3630  vessel]
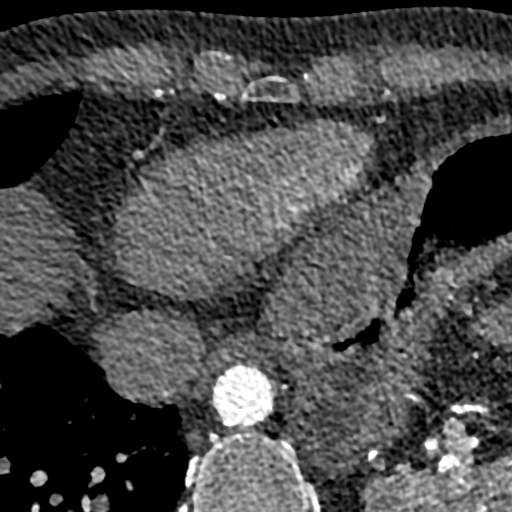
[im 726/3630  lung]
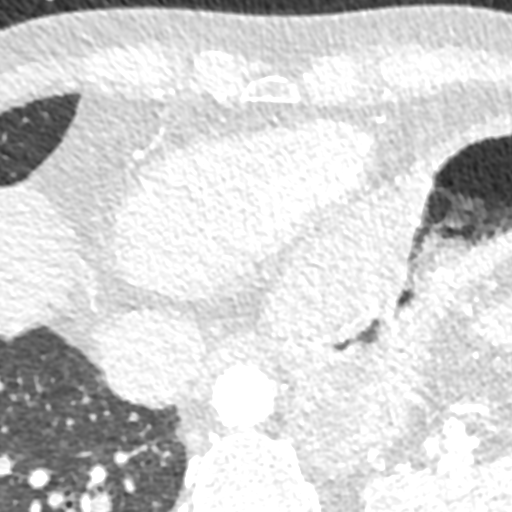
[im 1452/3630  vessel]
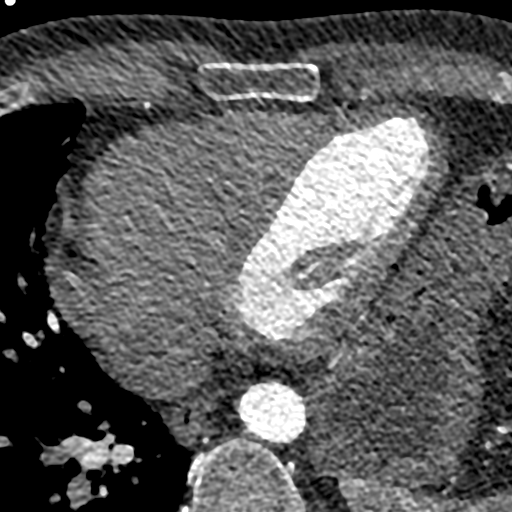
[im 2178/3630  vessel]
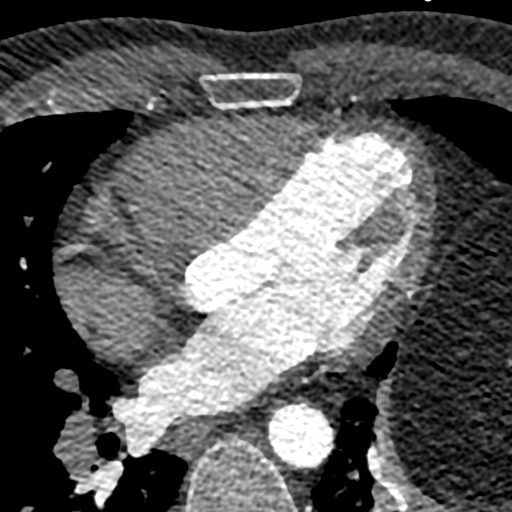
[im 2904/3630  vessel]
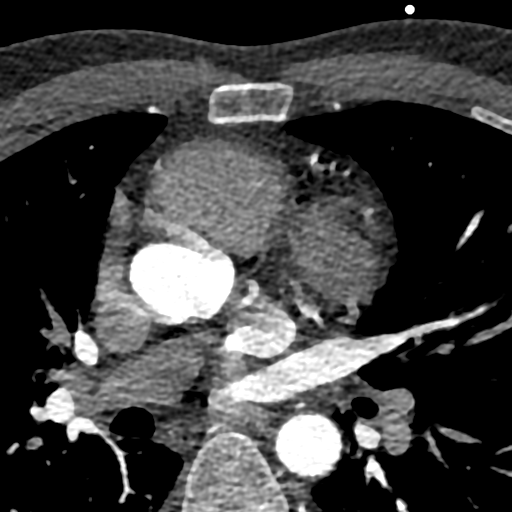

[Series 38: ms multiphase cta coronary 0.60 · axial · 0.34mm/px · z∈[-1080,-1014]mm · 3 of 2970 slices shown]
[im 743/2970  vessel]
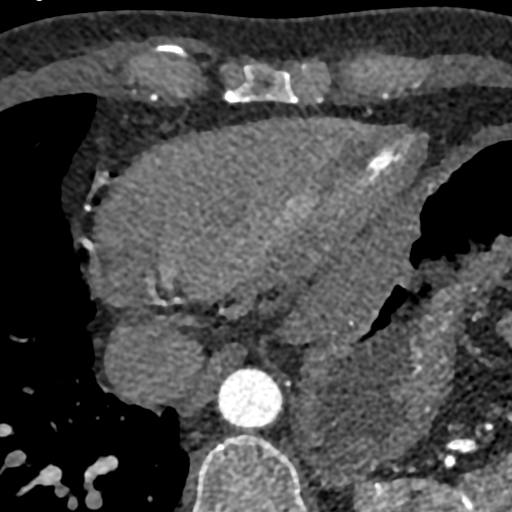
[im 1485/2970  vessel]
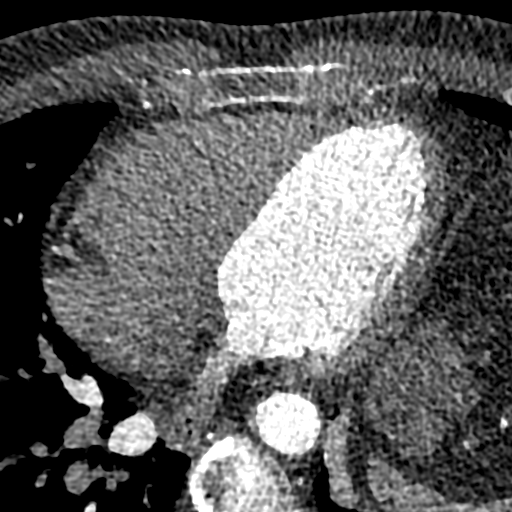
[im 2227/2970  vessel]
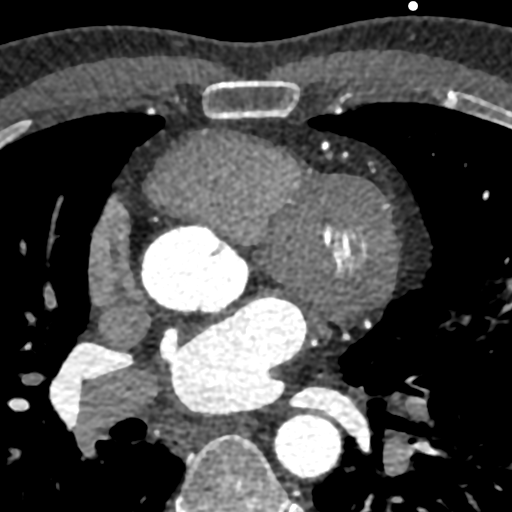

[7 of 20 positions shown; findings below may reference images not displayed]

:
A retrospective scan was triggered in the descending thoracic aorta.
Axial non-contrast 3 mm slices were carried out through the heart.
The data set was analyzed on a dedicated work station and scored
using the Agatson method. Gantry rotation speed was 330 msecs and
collimation was .6 mm. 50mg of metoprolol and 0.8 mg of sl NTG was
given. The 3D data set was reconstructed in 5% intervals of the
60-95 % of the R-R cycle. Diastolic phases were analyzed on a
dedicated work station using MPR, MIP and VRT modes. The patient
received 75 cc of contrast.
FINDINGS: Aorta:  Normal size.  No calcifications.  No dissection.

Aortic Valve:  Trileaflet.  No calcifications.

Coronary Arteries:  Normal coronary origin.  Right dominance.

RCA is a dominant artery that gives rise to PDA and PLA. There is no
plaque.

Left main is a large artery that gives rise to LAD and LCX arteries.
LM is free of disease

LAD has calcified and noncalcified plaque in the proximal segment
causing severe (>70%) stenosis.

LCX is a non-dominant artery that gives rise to two obtuse marginal
branches. There is no plaque.

Other findings:

Normal pulmonary vein drainage into the left atrium.

Normal left atrial appendage without a thrombus.

Normal size of the pulmonary artery.
IMPRESSION: 1. Coronary calcium score of 118. This was 89th percentile for age
and sex matched control.

2. Normal coronary origin with right dominance.

3. Calcified and non calcified plaque causing severe stenosis(>70%)
in the proximal LAD.

4. CAD-RADS 4 Severe stenosis. (70-99% or > 50% left main). Cardiac
catheterization is recommended. Additional analysis with CT FFR will
be submitted and reported separately.

5. Consider symptom-guided anti-ischemic pharmacotherapy as well as
risk factor modification per guideline directed care.

EXAM:
OVER-READ INTERPRETATION  CT CHEST

The following report is an over-read performed by radiologist Dr.
Ebadat Tiger [REDACTED] on 02/07/2021. This over-read
does not include interpretation of cardiac or coronary anatomy or
pathology. The coronary CTA interpretation by the cardiologist is
attached.
FINDINGS: Vascular: Aortic atherosclerosis. No central pulmonary embolism, on
this non-dedicated study.

Mediastinum/Nodes: No imaged thoracic adenopathy.

Lungs/Pleura: No pleural fluid. Marked left hemidiaphragm elevation.
Left lung base compressive atelectasis.

Upper Abdomen: Normal imaged portions of the liver, spleen, stomach.

Musculoskeletal: No acute osseous abnormality.
IMPRESSION: 1.  No acute findings in the imaged extracardiac chest.
2.  Aortic Atherosclerosis ([id number]).
3. Marked left hemidiaphragm elevation with adjacent left base
volume loss.

*** End of Addendum ***
:
A retrospective scan was triggered in the descending thoracic aorta.
Axial non-contrast 3 mm slices were carried out through the heart.
The data set was analyzed on a dedicated work station and scored
using the Agatson method. Gantry rotation speed was 330 msecs and
collimation was .6 mm. 50mg of metoprolol and 0.8 mg of sl NTG was
given. The 3D data set was reconstructed in 5% intervals of the
60-95 % of the R-R cycle. Diastolic phases were analyzed on a
dedicated work station using MPR, MIP and VRT modes. The patient
received 75 cc of contrast.
FINDINGS: Aorta:  Normal size.  No calcifications.  No dissection.

Aortic Valve:  Trileaflet.  No calcifications.

Coronary Arteries:  Normal coronary origin.  Right dominance.

RCA is a dominant artery that gives rise to PDA and PLA. There is no
plaque.

Left main is a large artery that gives rise to LAD and LCX arteries.
LM is free of disease

LAD has calcified and noncalcified plaque in the proximal segment
causing severe (>70%) stenosis.

LCX is a non-dominant artery that gives rise to two obtuse marginal
branches. There is no plaque.

Other findings:

Normal pulmonary vein drainage into the left atrium.

Normal left atrial appendage without a thrombus.

Normal size of the pulmonary artery.
IMPRESSION: 1. Coronary calcium score of 118. This was 89th percentile for age
and sex matched control.

2. Normal coronary origin with right dominance.

3. Calcified and non calcified plaque causing severe stenosis(>70%)
in the proximal LAD.

4. CAD-RADS 4 Severe stenosis. (70-99% or > 50% left main). Cardiac
catheterization is recommended. Additional analysis with CT FFR will
be submitted and reported separately.

5. Consider symptom-guided anti-ischemic pharmacotherapy as well as
risk factor modification per guideline directed care.

## 2022-10-08 ENCOUNTER — Ambulatory Visit: Payer: BC Managed Care – PPO | Admitting: Dermatology

## 2022-10-08 VITALS — BP 129/72

## 2022-10-08 DIAGNOSIS — W908XXA Exposure to other nonionizing radiation, initial encounter: Secondary | ICD-10-CM

## 2022-10-08 DIAGNOSIS — L578 Other skin changes due to chronic exposure to nonionizing radiation: Secondary | ICD-10-CM

## 2022-10-08 DIAGNOSIS — X32XXXA Exposure to sunlight, initial encounter: Secondary | ICD-10-CM

## 2022-10-08 DIAGNOSIS — S70362A Insect bite (nonvenomous), left thigh, initial encounter: Secondary | ICD-10-CM

## 2022-10-08 DIAGNOSIS — Z1283 Encounter for screening for malignant neoplasm of skin: Secondary | ICD-10-CM

## 2022-10-08 DIAGNOSIS — L821 Other seborrheic keratosis: Secondary | ICD-10-CM | POA: Diagnosis not present

## 2022-10-08 DIAGNOSIS — D1801 Hemangioma of skin and subcutaneous tissue: Secondary | ICD-10-CM

## 2022-10-08 DIAGNOSIS — D229 Melanocytic nevi, unspecified: Secondary | ICD-10-CM

## 2022-10-08 DIAGNOSIS — L814 Other melanin hyperpigmentation: Secondary | ICD-10-CM | POA: Diagnosis not present

## 2022-10-08 DIAGNOSIS — Z808 Family history of malignant neoplasm of other organs or systems: Secondary | ICD-10-CM

## 2022-10-08 DIAGNOSIS — Z7189 Other specified counseling: Secondary | ICD-10-CM

## 2022-10-08 DIAGNOSIS — B351 Tinea unguium: Secondary | ICD-10-CM

## 2022-10-08 DIAGNOSIS — S90562A Insect bite (nonvenomous), left ankle, initial encounter: Secondary | ICD-10-CM

## 2022-10-08 DIAGNOSIS — W57XXXA Bitten or stung by nonvenomous insect and other nonvenomous arthropods, initial encounter: Secondary | ICD-10-CM

## 2022-10-08 DIAGNOSIS — S90569A Insect bite (nonvenomous), unspecified ankle, initial encounter: Secondary | ICD-10-CM

## 2022-10-08 NOTE — Patient Instructions (Addendum)
[employer] with Permethrin clothes and Permethrin Spray for clothes      Due to recent changes in healthcare laws, you may see results of your pathology and/or laboratory studies on MyChart before the doctors have had a chance to review them. We understand that in some cases there may be results that are confusing or concerning to you. Please understand that not all results are received at the same time and often the doctors may need to interpret multiple results in order to provide you with the best plan of care or course of treatment. Therefore, we ask that you please give [place] 2 business days to thoroughly review all your results before contacting the office for clarification. Should we see a critical lab result, you will be contacted sooner.   If You Need Anything After Your Visit  If you have any questions or concerns for your doctor, please call our main line at [phone] and press option 4 to reach your doctor's medical assistant. If no one answers, please leave a voicemail as directed and we will return your call as soon as possible. Messages left after 4 pm will be answered the following business day.   You may also send [place] a message via MyChart. We typically respond to MyChart messages within 1-2 business days.  For prescription refills, please ask your pharmacy to contact our office. Our fax number is [phone].  If you have an urgent issue when the clinic is closed that cannot wait until the next business day, you can page your doctor at the number below.    Please note that while we do our best to be available for urgent issues outside of office hours, we are not available 24/7.   If you have an urgent issue and are unable to reach [place], you may choose to seek medical care at your doctor's office, retail clinic, urgent care center, or emergency room.  If you have a medical emergency, please immediately call 911 or go to the emergency department.  Pager Numbers  -  Dr. Gwen Pounds: [phone]  - Dr. Neale Burly: [phone]  - Dr. Roseanne Reno: [phone]  In the event of inclement weather, please call our main line at [phone] for an update on the status of any delays or closures.  Dermatology Medication Tips: Please keep the boxes that topical medications come in in order to help keep track of the instructions about where and how to use these. Pharmacies typically print the medication instructions only on the boxes and not directly on the medication tubes.   If your medication is too expensive, please contact our office at [phone] option 4 or send [place] a message through MyChart.   We are unable to tell what your co-pay for medications will be in advance as this is different depending on your insurance coverage. However, we may be able to find a substitute medication at lower cost or fill out paperwork to get insurance to cover a needed medication.   If a prior authorization is required to get your medication covered by your insurance company, please allow [place] 1-2 business days to complete this process.  Drug prices often vary depending on where the prescription is filled and some pharmacies may offer cheaper prices.  The website www.goodrx.com contains coupons for medications through different pharmacies. The prices here do not account for what the cost may be with help from insurance (it may be cheaper with your insurance), but the website can give you the price if you did  not use any insurance.  - You can print the associated coupon and take it with your prescription to the pharmacy.  - You may also stop by our office during regular business hours and pick up a GoodRx coupon card.  - If you need your prescription sent electronically to a different pharmacy, notify our office through [facility] or by phone at [phone] option 4.     Si Usted Necesita Algo Despus de Su Visita  Tambin puede enviarnos un mensaje a travs de Clinical cytogeneticist. Por  lo general respondemos a los mensajes de MyChart en el transcurso de 1 a 2 das hbiles.  Para renovar recetas, por favor pida a su farmacia que se ponga en contacto con nuestra oficina. Annie Sable de fax es [place] [phone].  Si tiene un asunto urgente cuando la clnica est cerrada y que no puede esperar hasta el siguiente da hbil, puede llamar/localizar a su doctor(a) al nmero que aparece a continuacin.   Por favor, tenga en cuenta que aunque hacemos todo lo posible para estar disponibles para asuntos urgentes fuera del horario de [place], no estamos disponibles las 24 horas del da, los 7 [address]  [address] de la [place].   Si tiene un problema urgente y no puede comunicarse con nosotros, puede optar por buscar atencin mdica  en el consultorio de su doctor(a), en una clnica privada, en un centro de atencin urgente o en una sala de emergencias.  Si tiene Engineer, drilling, por favor llame inmediatamente al 911 o vaya a la sala de emergencias.  Nmeros de bper  - Dr. Gwen Pounds: [phone]  - Dra. Moye: [phone]  - Dra. Roseanne Reno: [phone]  En caso de inclemencias del [place], por favor llame a Lacy Duverney principal al [phone] para una actualizacin sobre el [place] de cualquier retraso o cierre.  Consejos para la medicacin en dermatologa: Por favor, guarde las cajas en las que vienen los medicamentos de uso tpico para ayudarle a seguir las instrucciones sobre dnde y cmo usarlos. Las farmacias generalmente imprimen las instrucciones del medicamento slo en las cajas y no directamente en los tubos del [place].   Si su medicamento es muy caro, por favor, pngase en contacto con Rolm Gala llamando al [phone] y presione la opcin 4 o envenos un mensaje a travs de Clinical cytogeneticist.   No podemos decirle cul ser su copago por los medicamentos por adelantado ya que esto es diferente dependiendo de la cobertura de su seguro. Sin embargo, es posible que podamos encontrar  un medicamento sustituto a Audiological scientist un formulario para que el seguro cubra el medicamento que se considera necesario.   Si se requiere una autorizacin previa para que su compaa de seguros [place] su medicamento, por favor permtanos de 1 a 2 das hbiles para completar [address]  Los precios de los medicamentos varan con frecuencia dependiendo del Environmental consultant de dnde se surte la receta y alguna farmacias pueden ofrecer precios ms baratos.  El sitio web www.goodrx.com tiene cupones para medicamentos de Health and safety inspector. Los precios aqu no tienen en cuenta lo que podra costar con la ayuda del seguro (puede ser ms barato con su seguro), pero el sitio web puede darle el precio si no utiliz Tourist information centre manager.  - Puede imprimir el cupn correspondiente y llevarlo con su receta a la farmacia.  - Tambin puede pasar por nuestra oficina durante el horario de atencin regular y Education officer, museum una tarjeta de cupones de GoodRx.  - Si necesita que su receta se enve  electrnicamente a [place] diferente, informe a nuestra oficina a travs de MyChart de [place] o por telfono llamando al [phone] y presione la opcin 4.

## 2022-10-08 NOTE — Progress Notes (Unsigned)
   New Patient Visit   Subjective  John Christensen is a 52 y.o. male who presents for the following: Skin Cancer Screening and Full Body Skin Exam, no hx of skin cancer, Niece with hx of skin cancer, check toenails, fungus, pt was txted with 45m lamisil finished 05/2022 The patient presents for Total-Body Skin Exam (TBSE) for skin cancer screening and mole check. The patient has spots, moles and lesions to be evaluated, some may be new or changing and the patient has concerns that these could be cancer.  New patient referral from Violet Baldy, DO.  The following portions of the chart were reviewed this encounter and updated as appropriate: medications, allergies, medical history  Review of Systems:  No other skin or systemic complaints except as noted in HPI or Assessment and Plan.  Objective  Well appearing patient in no apparent distress; mood and affect are within normal limits.  A full examination was performed including scalp, head, eyes, ears, nose, lips, neck, chest, axillae, abdomen, back, buttocks, bilateral upper extremities, bilateral lower extremities, hands, feet, fingers, toes, fingernails, and toenails. All findings within normal limits unless otherwise noted below.   Relevant physical exam findings are noted in the Assessment and Plan.   Assessment & Plan   LENTIGINES, SEBORRHEIC KERATOSES, HEMANGIOMAS - Benign normal skin lesions - Benign-appearing - Call for any changes  MELANOCYTIC NEVI - Tan-brown and/or pink-flesh-colored symmetric macules and papules - Benign appearing on exam today - Observation - Call clinic for new or changing moles - Recommend daily use of broad spectrum spf 30+ sunscreen to sun-exposed areas.   ACTINIC DAMAGE - Chronic condition, secondary to cumulative UV/sun exposure - diffuse scaly erythematous macules with underlying dyspigmentation - Recommend daily broad spectrum sunscreen SPF 30+ to sun-exposed areas, reapply every 2  hours as needed.  - Staying in the shade or wearing long sleeves, sun glasses (UVA+UVB protection) and wide brim hats (4-inch brim around the entire circumference of the hat) are also recommended for sun protection.  - Call for new or changing lesions.  SKIN CANCER SCREENING PERFORMED TODAY.  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 Condition is symptomatic/ bothersome to patient. Not currently at goal. Treatment Plan: Hx of 3 months treatment of Lamisil, last treatment 05/2022.  Do not recommend any more treatment at this time.  Residual dystrophic nail should grow out over time.  Recheck next visit.  BITE REACTION Exam: pink pap L post thigh, L ankle Treatment Plan: Benign, Observe Discussed [employer], Permethrin Spray for clothes  Return in about 2 years (around 10/07/2024) for TBSE.  I, Ardis Rowan, RMA, am acting as scribe for Armida Sans, MD .  Documentation: I have reviewed the above documentation for accuracy and completeness, and I agree with the above.  Armida Sans, MD

## 2022-10-09 ENCOUNTER — Encounter: Payer: Self-pay | Admitting: Dermatology

## 2022-11-12 ENCOUNTER — Other Ambulatory Visit: Payer: Self-pay

## 2022-11-12 MED ORDER — ATORVASTATIN CALCIUM 40 MG PO TABS: 40.0000 mg | ORAL_TABLET | Freq: Every day | ORAL | 1 refills | Status: DC

## 2022-12-05 ENCOUNTER — Other Ambulatory Visit: Payer: Self-pay

## 2022-12-05 MED ORDER — ATORVASTATIN CALCIUM 40 MG PO TABS: 40.0000 mg | ORAL_TABLET | Freq: Every day | ORAL | 0 refills | Status: DC

## 2023-01-26 ENCOUNTER — Encounter: Payer: Self-pay | Admitting: Cardiology

## 2023-01-26 ENCOUNTER — Ambulatory Visit: Payer: BC Managed Care – PPO | Attending: Cardiology | Admitting: Cardiology

## 2023-01-26 VITALS — BP 122/88 | HR 69 | Ht 69.0 in | Wt 175.6 lb

## 2023-01-26 DIAGNOSIS — E785 Hyperlipidemia, unspecified: Secondary | ICD-10-CM

## 2023-01-26 DIAGNOSIS — I2510 Atherosclerotic heart disease of native coronary artery without angina pectoris: Secondary | ICD-10-CM | POA: Diagnosis not present

## 2023-01-26 MED ORDER — NITROGLYCERIN 0.4 MG SL SUBL: 0.4000 mg | SUBLINGUAL_TABLET | SUBLINGUAL | 1 refills | Status: DC | PRN

## 2023-01-26 NOTE — Patient Instructions (Signed)
Medication Instructions:   Your physician recommends that you continue on your current medications as directed. Please refer to the Current Medication list given to you today.  *If you need a refill on your cardiac medications before your next appointment, please call your pharmacy*   Lab Work:  None ordered  If you have labs (blood work) drawn today and your tests are completely normal, you will receive your results only by: [place] (if you have MyChart) OR A paper copy in the mail If you have any lab test that is abnormal or we need to change your treatment, we will call you to review the results.   Testing/Procedures:  Your physician has requested that you have an echocardiogram. Echocardiography is a painless test that uses sound waves to create images of your heart. It provides your doctor with information about the size and shape of your heart and how well your heart's chambers and valves are working. This procedure takes approximately one hour. There are no restrictions for this procedure. Please do NOT wear cologne, perfume, aftershave, or lotions (deodorant is allowed). Please arrive 15 minutes prior to your appointment time.    Follow-Up: At [facility], you and your health needs are our priority.  As part of our continuing mission to provide you with exceptional heart care, we have created designated Provider Care Teams.  These Care Teams include your primary Cardiologist (physician) and Advanced Practice Providers (APPs -  Physician Assistants and Nurse Practitioners) who all work together to provide you with the care you need, when you need it.  We recommend signing up for the patient portal called "MyChart".  Sign up information is provided on this After Visit Summary.  MyChart is used to connect with patients for Virtual Visits (Telemedicine).  Patients are able to view lab/test results, encounter notes, upcoming appointments, etc.  Non-urgent messages can  be sent to your provider as well.   To learn more about what you can do with MyChart, go to [email or web address].    Your next appointment:   12 month(s)  Provider:   You may see Kate Sable, MD or one of the following Advanced Practice Providers on your designated Care Team:   Murray Hodgkins, NP Christell Faith, PA-C Cadence Kathlen Mody, PA-C Gerrie Nordmann, NP

## 2023-01-26 NOTE — Progress Notes (Signed)
Cardiology Office Note:    Date:  01/26/2023   ID:  John Christensen, DOB Aug 20, 1970, MRN [id number]  PCP:  Smitty Cords, DO   [facility] Providers Cardiologist:  Debbe Odea, MD     Referring MD: Saralyn Pilar *   Chief Complaint  Patient presents with   Follow-up    Patient denies new or acute cardiac problems/concerns today.      History of Present Illness:    John Christensen is a 52 y.o. male with history of CAD (s/p PCI to proximal LAD 01/2021),  presents for follow-up.  Feels well, denies chest pain or shortness of breath, compliant with medications including Plavix, Lipitor as prescribed.  Blood pressure is well-controlled, cholesterol also controlled.  Has rare shortness of breath occurring randomly.  Otherwise feels well.  Prior notes LHC 01/2021.  90% proximal LAD, no significant left circumflex or RCA disease.  S/p drug-eluting stent to proximal LAD. Echo 01/2021 EF 55 to 60% Cardiac monitor 04/2021, paroxysmal SVT, rare PACs and PVCs.  Patient triggered events associated with PACs.   Past Medical History:  Diagnosis Date   Cardiac dysrhythmia, unspecified    Other malaise and fatigue    Unspecified congenital anomaly of heart     Past Surgical History:  Procedure Laterality Date   COLONOSCOPY WITH PROPOFOL N/A 01/30/2020   Procedure: COLONOSCOPY WITH PROPOFOL;  Surgeon: Toney Reil, MD;  Location: [facility] ENDOSCOPY;  Service: Gastroenterology;  Laterality: N/A;   CORONARY STENT INTERVENTION N/A 02/12/2021   Procedure: CORONARY STENT INTERVENTION;  Surgeon: Yvonne Kendall, MD;  Location: [facility] INVASIVE CV LAB;  Service: Cardiovascular;  Laterality: N/A;   LEFT HEART CATH AND CORONARY ANGIOGRAPHY Left 02/12/2021   Procedure: LEFT HEART CATH AND CORONARY ANGIOGRAPHY;  Surgeon: Yvonne Kendall, MD;  Location: [facility] INVASIVE CV LAB;  Service: Cardiovascular;  Laterality: Left;    Current Medications: Current Meds  Medication Sig    acetaminophen (TYLENOL) 500 MG tablet Take 500-1,000 mg by mouth every 6 (six) hours as needed for moderate pain.   atorvastatin (LIPITOR) 40 MG tablet Take 1 tablet (40 mg total) by mouth daily.   clopidogrel (PLAVIX) 75 MG tablet TAKE 1 TABLET BY MOUTH DAILY WITH BREAKFAST.   [DISCONTINUED] nitroGLYCERIN (NITROSTAT) 0.4 MG SL tablet Place 1 tablet (0.4 mg total) under the tongue every 5 (five) minutes as needed for chest pain. For a maximum of 3 doses     Allergies:   Patient has no known allergies.   Social History   Socioeconomic History   Marital status: Married    Spouse name: Not on file   Number of children: Not on file   Years of education: Not on file   Highest education level: Not on file  Occupational History   Not on file  Tobacco Use   Smoking status: Never   Smokeless tobacco: Never  Vaping Use   Vaping status: Never Used  Substance and Sexual Activity   Alcohol use: No   Drug use: No   Sexual activity: Not on file  Other Topics Concern   Not on file  Social History Narrative   Not on file   Social Determinants of Health   Financial Resource Strain: Not on file  Food Insecurity: Not on file  Transportation Needs: Not on file  Physical Activity: Not on file  Stress: Not on file  Social Connections: Not on file     Family History: The patient's family history includes Hyperlipidemia in his father  and mother. There is no history of Colon cancer or Prostate cancer.  ROS:   Please see the history of present illness.     All other systems reviewed and are negative.  EKGs/Labs/Other Studies Reviewed:    The following studies were reviewed today:   EKG Interpretation Date/Time:  Monday January 26 2023 14:26:50 EDT Ventricular Rate:  69 PR Interval:  152 QRS Duration:  90 QT Interval:  368 QTC Calculation: 394 R Axis:   31  Text Interpretation: Normal sinus rhythm Normal ECG Confirmed by Debbe Odea ([id number]) on 01/26/2023 2:44:31 PM     Recent Labs: 01/29/2022: ALT 26; BUN 19; Creat 0.96; Hemoglobin 15.3; Platelets 280; Potassium 4.2; Sodium 141; TSH 1.28  Recent Lipid Panel    Component Value Date/Time   CHOL 135 01/29/2022 0905   CHOL 134 04/29/2021 1439   TRIG 49 01/29/2022 0905   HDL 56 01/29/2022 0905   HDL 55 04/29/2021 1439   CHOLHDL 2.4 01/29/2022 0905   VLDL 9 09/09/2016 0922   LDLCALC 65 01/29/2022 0905   LDLDIRECT 65 04/29/2021 1439     Risk Assessment/Calculations:          Physical Exam:    VS:  BP 122/88 (BP Location: Left Arm, Patient Position: Sitting, Cuff Size: Normal)   Pulse 69   Ht 5\' 9"  (1.753 m)   Wt 175 lb 9.6 oz (79.7 kg)   SpO2 98%   BMI 25.93 kg/m     Wt Readings from Last 3 Encounters:  01/26/23 175 lb 9.6 oz (79.7 kg)  01/31/22 169 lb (76.7 kg)  01/29/22 171 lb 9.6 oz (77.8 kg)     GEN:  Well nourished, well developed in no acute distress HEENT: Normal NECK: No JVD; No carotid bruits CARDIAC: RRR, no murmurs, rubs, gallops RESPIRATORY:  Clear to auscultation without rales, wheezing or rhonchi  ABDOMEN: Soft, non-tender, non-distended MUSCULOSKELETAL:  No edema; No deformity  SKIN: Warm and dry NEUROLOGIC:  Alert and oriented x 3 PSYCHIATRIC:  Normal affect   ASSESSMENT:    1. Coronary artery disease involving native coronary artery of native heart without angina pectoris   2. Hyperlipidemia LDL goal <70    PLAN:    In order of problems listed above:  CAD s/p DES to proximal LAD 01/2021.  Echo 9/22 EF 55 to 60%.  Denies chest pain.  Continue Plavix, Lipitor 40 mg daily. Hyperlipidemia, LDL at goal.  Continue Lipitor 40 mg daily.  Follow-up in 1 year   Medication Adjustments/Labs and Tests Ordered: Current medicines are reviewed at length with the patient today.  Concerns regarding medicines are outlined above.  Orders Placed This Encounter  Procedures   EKG 12-Lead   ECHOCARDIOGRAM COMPLETE     Meds ordered this encounter  Medications    nitroGLYCERIN (NITROSTAT) 0.4 MG SL tablet    Sig: Place 1 tablet (0.4 mg total) under the tongue every 5 (five) minutes as needed for chest pain. For a maximum of 3 doses    Dispense:  25 tablet    Refill:  1      Patient Instructions  Medication Instructions:   Your physician recommends that you continue on your current medications as directed. Please refer to the Current Medication list given to you today.  *If you need a refill on your cardiac medications before your next appointment, please call your pharmacy*   Lab Work:  None ordered  If you have labs (blood work) drawn today and your tests are  completely normal, you will receive your results only by: MyChart Message (if you have MyChart) OR A paper copy in the mail If you have any lab test that is abnormal or we need to change your treatment, we will call you to review the results.   Testing/Procedures:  Your physician has requested that you have an echocardiogram. Echocardiography is a painless test that uses sound waves to create images of your heart. It provides your doctor with information about the size and shape of your heart and how well your heart's chambers and valves are working. This procedure takes approximately one hour. There are no restrictions for this procedure. Please do NOT wear cologne, perfume, aftershave, or lotions (deodorant is allowed). Please arrive 15 minutes prior to your appointment time.    Follow-Up: At [facility], you and your health needs are our priority.  As part of our continuing mission to provide you with exceptional heart care, we have created designated Provider Care Teams.  These Care Teams include your primary Cardiologist (physician) and Advanced Practice Providers (APPs -  Physician Assistants and Nurse Practitioners) who all work together to provide you with the care you need, when you need it.  We recommend signing up for the patient portal called "MyChart".  Sign  up information is provided on this After Visit Summary.  MyChart is used to connect with patients for Virtual Visits (Telemedicine).  Patients are able to view lab/test results, encounter notes, upcoming appointments, etc.  Non-urgent messages can be sent to your provider as well.   To learn more about what you can do with MyChart, go to [email or web address].    Your next appointment:   12 month(s)  Provider:   You may see Debbe Odea, MD or one of the following Advanced Practice Providers on your designated Care Team:   Nicolasa Ducking, NP Eula Listen, PA-C Cadence Fransico Michael, PA-C Charlsie Quest, NP   Signed, Debbe Odea, MD  01/26/2023 3:29 PM    [facility]

## 2023-02-06 ENCOUNTER — Encounter: Payer: BC Managed Care – PPO | Admitting: Family Medicine

## 2023-02-07 ENCOUNTER — Other Ambulatory Visit: Payer: Self-pay | Admitting: Physician Assistant

## 2023-02-18 ENCOUNTER — Other Ambulatory Visit: Payer: BC Managed Care – PPO

## 2023-02-19 ENCOUNTER — Ambulatory Visit (INDEPENDENT_AMBULATORY_CARE_PROVIDER_SITE_OTHER): Payer: BC Managed Care – PPO | Admitting: Family Medicine

## 2023-02-19 ENCOUNTER — Encounter: Payer: Self-pay | Admitting: Family Medicine

## 2023-02-19 ENCOUNTER — Ambulatory Visit: Payer: BC Managed Care – PPO | Attending: Cardiology

## 2023-02-19 VITALS — BP 112/66 | HR 69 | Ht 69.0 in | Wt 176.0 lb

## 2023-02-19 DIAGNOSIS — I2510 Atherosclerotic heart disease of native coronary artery without angina pectoris: Secondary | ICD-10-CM

## 2023-02-19 DIAGNOSIS — Z125 Encounter for screening for malignant neoplasm of prostate: Secondary | ICD-10-CM | POA: Diagnosis not present

## 2023-02-19 DIAGNOSIS — Z23 Encounter for immunization: Secondary | ICD-10-CM

## 2023-02-19 DIAGNOSIS — Z0000 Encounter for general adult medical examination without abnormal findings: Secondary | ICD-10-CM

## 2023-02-19 DIAGNOSIS — Z131 Encounter for screening for diabetes mellitus: Secondary | ICD-10-CM | POA: Diagnosis not present

## 2023-02-19 DIAGNOSIS — E785 Hyperlipidemia, unspecified: Secondary | ICD-10-CM | POA: Diagnosis not present

## 2023-02-19 DIAGNOSIS — B351 Tinea unguium: Secondary | ICD-10-CM

## 2023-02-19 LAB — ECHOCARDIOGRAM COMPLETE
Area-P 1/2: 3.31 cm2
Height: 69 in
S' Lateral: 4 cm
Weight: 2816 [oz_av]

## 2023-02-19 MED ORDER — TERBINAFINE HCL 250 MG PO TABS: 250.0000 mg | ORAL_TABLET | Freq: Every day | ORAL | 2 refills | Status: AC

## 2023-02-19 NOTE — Patient Instructions (Addendum)
Thank you for coming to the office today.  Labs today  Re try the toenail fungal medication. 3 month course.  Consider Saw Tyna Jaksch is the herbal remedy OTC if interested.   Please schedule a Follow-up Appointment to: Return in about 1 year (around 02/19/2024) for 1 Year Annual Physical AM (1st apt AM, fasting lab AFTER).  If you have any other questions or concerns, please feel free to call the office or send a message through MyChart. You may also schedule an earlier appointment if necessary.  Additionally, you may be receiving a survey about your experience at our office within a few days to 1 week by e-mail or mail. We value your feedback.  Saralyn Pilar, DO [facility], [place]

## 2023-02-19 NOTE — Progress Notes (Signed)
Subjective:    Patient ID: John Christensen, male    DOB: [birth year], 52 y.o.   MRN: [id number]  John Christensen is a 52 y.o. male presenting on 02/19/2023 for Annual Exam   HPI  Discussed the use of AI scribe software for clinical note transcription with the patient, who gave verbal consent to proceed.  Here for Annual Physical and Due for lab.     The patient, with a history of heart disease managed with a stent, presented for a routine check-up. They reported no significant health issues over the past year, maintaining a stable weight and engaging in regular physical activity, including daily walking for work and on weekends.  The patient has been on atorvastatin 40mg  and clopidogrel 75mg  daily for heart health, and carries nitroglycerin as a precaution, though they have not needed to use it. They completed a three-month course of terbinafine for a suspected fungal infection in both great toes. The patient reported some improvement, but was unsure if the treatment was fully effective.  The patient also reported a urinary issue, specifically incomplete emptying of the bladder in the mornings. This issue was not bothersome enough to seek treatment, and did not affect the patient's sleep. The patient's blood pressure was well-controlled, and they had no other significant symptoms or health concerns.       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 Not on medications or supplements   CAD, s/p PCI Stent History of Episodic Dyspnea / Chest Discomfort Hyperlipidemia Followed by [facility], identified significant 90% LAD blockage, s/p DES stent  -----------------   Urinary Frequency He still has the urge to have repeat urination in morning and some incomplete emptying.  Usually in  AM.   Onychomycosis, great toes Previously we treated both great toes with Terbinafine oral for anti fungal course, completed 3 months, had improvement. Now still unsure on progress. But says bottom half of each great toenail is normal it is improving. - No other nail affected. - No prior nailbed damage.     Health Maintenance:   Declines vaccines today flu shingrix covid  Prostate CA Screening: Last PSA negative x 2 last 2022. Currently very mild frequency see above. No known family history of prostate CA. Due for screening.   Colon CA Screening: Last colonoscopy 01/30/20 Dr Allegra Lai no polyps, repeat 10 years approx 2031. Currently asymptomatic. No known family history of colon CA.     02/19/2023    8:03 AM 01/29/2022    8:47 AM 05/29/2021    4:53 PM  Depression screen PHQ 2/9  Decreased Interest 0 0 0  Down, Depressed, Hopeless 0 0 0  PHQ - 2 Score 0 0 0  Altered sleeping 0 0 0  Tired, decreased energy 0 0 0  Change in appetite 0 0 0  Feeling bad or failure about yourself  0 0 0  Trouble concentrating 0 0 0  Moving slowly or fidgety/restless 0 0 0  Suicidal thoughts 0 0 0  PHQ-9 Score 0 0 0  Difficult doing work/chores  Not difficult at all Not difficult at all    Past Medical History:  Diagnosis Date   Cardiac dysrhythmia, unspecified    Other malaise and fatigue    Unspecified congenital anomaly of heart    Past Surgical History:  Procedure Laterality Date   COLONOSCOPY WITH PROPOFOL N/A 01/30/2020   Procedure: COLONOSCOPY WITH PROPOFOL;  Surgeon: Toney Reil, MD;  Location[facility] ENDOSCOPY;  Service: Gastroenterology;  Laterality: N/A;   CORONARY STENT INTERVENTION N/A 02/12/2021   Procedure: CORONARY STENT INTERVENTION;  Surgeon: Yvonne Kendall, MD;  Location: [facility] INVASIVE CV LAB;  Service: Cardiovascular;  Laterality: N/A;   LEFT HEART CATH AND CORONARY ANGIOGRAPHY Left 02/12/2021   Procedure: LEFT HEART CATH AND CORONARY ANGIOGRAPHY;  Surgeon: Yvonne Kendall, MD;  Location: [facility] INVASIVE CV LAB;  Service: Cardiovascular;  Laterality: Left;   Social History   Socioeconomic History   Marital status: Married    Spouse name: Not on file   Number of children: Not on file   Years of education: Not on file   Highest education level: Not on file  Occupational History   Not on file  Tobacco Use   Smoking status: Never   Smokeless tobacco: Never  Vaping Use   Vaping status: Never Used  Substance and Sexual Activity   Alcohol use: Yes    Comment: 2 a month, some months none   Drug use: Never   Sexual activity: Yes    Birth control/protection: Condom  Other Topics Concern   Not on file  Social History Narrative   Not on file   Social Determinants of Health   Financial Resource Strain: Not on file  Food Insecurity: Not on file  Transportation Needs: Not on file  Physical Activity: Not on file  Stress: Not on file  Social Connections: Not on file  Intimate Partner Violence: Not on file   Family History  Problem Relation Age of Onset   Hyperlipidemia Mother    Hyperlipidemia Father    Colon cancer Neg Hx    Prostate cancer Neg Hx    Current Outpatient Medications on File Prior to Visit  Medication Sig   acetaminophen (TYLENOL) 500 MG tablet Take 500-1,000 mg by mouth every 6 (six) hours as needed for moderate pain.   atorvastatin (LIPITOR) 40 MG tablet Take 1 tablet (40 mg total) by mouth daily.   clopidogrel (PLAVIX) 75 MG tablet TAKE 1 TABLET BY MOUTH EVERY DAY WITH BREAKFAST   nitroGLYCERIN (NITROSTAT) 0.4 MG SL tablet Place 1 tablet (0.4 mg total) under the tongue every 5 (five) minutes as needed for chest pain. For a maximum of 3 doses   No current facility-administered medications on file prior to visit.    Review of Systems  Constitutional:  Negative for activity change, appetite change, chills, diaphoresis, fatigue and fever.  HENT:  Negative for congestion and hearing loss.   Eyes:  Negative for visual  disturbance.  Respiratory:  Negative for cough, chest tightness, shortness of breath and wheezing.   Cardiovascular:  Negative for chest pain, palpitations and leg swelling.  Gastrointestinal:  Negative for abdominal pain, constipation, diarrhea, nausea and vomiting.  Genitourinary:  Negative for dysuria, frequency and hematuria.  Musculoskeletal:  Negative for arthralgias and neck pain.  Skin:  Negative for rash.  Neurological:  Negative for dizziness, weakness, light-headedness, numbness and headaches.  Hematological:  Negative for adenopathy.  Psychiatric/Behavioral:  Negative for behavioral problems, dysphoric mood and sleep disturbance.    Per HPI unless specifically indicated above     Objective:    BP 112/66   Pulse 69   Ht 5\' 9"  (1.753 m)   Wt 176 lb (79.8 kg)   SpO2 98%   BMI 25.99 kg/m   Wt Readings from  Last 3 Encounters:  02/19/23 176 lb (79.8 kg)  01/26/23 175 lb 9.6 oz (79.7 kg)  01/31/22 169 lb (76.7 kg)    Physical Exam Vitals and nursing note reviewed.  Constitutional:      General: He is not in acute distress.    Appearance: He is well-developed. He is not diaphoretic.     Comments: Well-appearing, comfortable, cooperative  HENT:     Head: Normocephalic and atraumatic.  Eyes:     General:        Right eye: No discharge.        Left eye: No discharge.     Conjunctiva/sclera: Conjunctivae normal.     Pupils: Pupils are equal, round, and reactive to light.  Neck:     Thyroid: No thyromegaly.  Cardiovascular:     Rate and Rhythm: Normal rate and regular rhythm.     Pulses: Normal pulses.     Heart sounds: Normal heart sounds. No murmur heard. Pulmonary:     Effort: Pulmonary effort is normal. No respiratory distress.     Breath sounds: Normal breath sounds. No wheezing or rales.  Abdominal:     General: Bowel sounds are normal. There is no distension.     Palpations: Abdomen is soft. There is no mass.     Tenderness: There is no abdominal  tenderness.  Musculoskeletal:        General: No tenderness. Normal range of motion.     Cervical back: Normal range of motion and neck supple.     Comments: Upper / Lower Extremities: - Normal muscle tone, strength bilateral upper extremities 5/5, lower extremities 5/5  Lymphadenopathy:     Cervical: No cervical adenopathy.  Skin:    General: Skin is warm and dry.     Findings: No erythema or rash.  Neurological:     Mental Status: He is alert and oriented to person, place, and time.     Comments: Distal sensation intact to light touch all extremities  Psychiatric:        Mood and Affect: Mood normal.        Behavior: Behavior normal.        Thought Content: Thought content normal.     Comments: Well groomed, good eye contact, normal speech and thoughts      Results for orders placed or performed in visit on 02/19/23  Lipid panel  Result Value Ref Range   Cholesterol 138 <200 mg/dL   HDL 60 > OR = 40 mg/dL   Triglycerides 41 <829 mg/dL   LDL Cholesterol (Calc) 66 mg/dL (calc)   Total CHOL/HDL Ratio 2.3 <5.0 (calc)   Non-HDL Cholesterol (Calc) 78 <562 mg/dL (calc)  COMPLETE METABOLIC PANEL WITH GFR  Result Value Ref Range   Glucose, Bld 104 (H) 65 - 99 mg/dL   BUN 20 7 - 25 mg/dL   Creat 1.30 8.65 - 7.84 mg/dL   eGFR 85 > OR = 60 ON/GEX/5.28U1   BUN/Creatinine Ratio SEE NOTE: 6 - 22 (calc)   Sodium 140 135 - 146 mmol/L   Potassium 4.7 3.5 - 5.3 mmol/L   Chloride 105 98 - 110 mmol/L   CO2 27 20 - 32 mmol/L   Calcium 9.7 8.6 - 10.3 mg/dL   Total Protein 7.0 6.1 - 8.1 g/dL   Albumin 4.5 3.6 - 5.1 g/dL   Globulin 2.5 1.9 - 3.7 g/dL (calc)   AG Ratio 1.8 1.0 - 2.5 (calc)   Total Bilirubin 0.7 0.2 - 1.2  mg/dL   Alkaline phosphatase (APISO) 66 35 - 144 U/L   AST 17 10 - 35 U/L   ALT 23 9 - 46 U/L  CBC with Differential/Platelet  Result Value Ref Range   WBC 5.4 3.8 - 10.8 Thousand/uL   RBC 5.44 4.20 - 5.80 Million/uL   Hemoglobin 15.7 13.2 - 17.1 g/dL   HCT 24.4 01.0  - 27.2 %   MCV 88.8 80.0 - 100.0 fL   MCH 28.9 27.0 - 33.0 pg   MCHC 32.5 32.0 - 36.0 g/dL   RDW 53.6 64.4 - 03.4 %   Platelets 258 140 - 400 Thousand/uL   MPV 9.6 7.5 - 12.5 fL   Neutro Abs 3,137 1,500 - 7,800 cells/uL   Lymphs Abs 1,625 850 - 3,900 cells/uL   Absolute Monocytes 508 200 - 950 cells/uL   Eosinophils Absolute 92 15 - 500 cells/uL   Basophils Absolute 38 0 - 200 cells/uL   Neutrophils Relative % 58.1 %   Total Lymphocyte 30.1 %   Monocytes Relative 9.4 %   Eosinophils Relative 1.7 %   Basophils Relative 0.7 %      Assessment & Plan:   Problem List Items Addressed This Visit     CAD in native artery   Relevant Orders   Lipid panel (Completed)   COMPLETE METABOLIC PANEL WITH GFR (Completed)   CBC with Differential/Platelet (Completed)   Hyperlipidemia LDL goal <70   Relevant Orders   Lipid panel (Completed)   COMPLETE METABOLIC PANEL WITH GFR (Completed)   TSH   Other Visit Diagnoses     Annual physical exam    -  Primary   Relevant Orders   Hemoglobin A1c   Lipid panel (Completed)   COMPLETE METABOLIC PANEL WITH GFR (Completed)   CBC with Differential/Platelet (Completed)   PSA   TSH   Screening for diabetes mellitus (DM)       Relevant Orders   Hemoglobin A1c   Screening for prostate cancer       Relevant Orders   PSA   Onychomycosis of great toe       Relevant Medications   terbinafine (LAMISIL) 250 MG tablet       Updated Health Maintenance information Fasting labs today, pending results. Encouraged improvement to lifestyle with diet and exercise Goal of weight loss  Assessment and Plan    Coronary Artery Disease Stable with no new symptoms. On Atorvastatin 40mg  daily and Clopidogrel 75mg  daily. Regular follow-up with cardiologist and echocardiogram scheduled. -Continue current medications. -Monitor symptoms and follow up with cardiologist as scheduled.  Onychomycosis Previous treatment with Terbinafine for 3 months showed some  improvement but not complete resolution. No history of trauma to the toenails. -Retry Terbinafine treatment for another 3 months. -Evaluate progress after treatment.  Benign Prostatic Hyperplasia Reports incomplete bladder emptying in the mornings but no nocturia or other urinary symptoms. No treatment currently. -Consider Saw Palmetto if symptoms worsen or become bothersome.  General Health Maintenance -Complete blood work today including chemistry, cholesterol, blood count, PSA, A1c, and thyroid. -Continue regular exercise regimen. -Colonoscopy due in 2031. -Consider Shingles vaccine at pharmacy if desired. -Schedule annual check-up for next year.       Meds ordered this encounter  Medications   terbinafine (LAMISIL) 250 MG tablet    Sig: Take 1 tablet (250 mg total) by mouth daily. For total of 3 months for onychomycosis toenail    Dispense:  30 tablet    Refill:  2  Follow up plan: Return in about 1 year (around 02/19/2024) for 1 Year Annual Physical AM (1st apt AM, fasting lab AFTER).  Saralyn Pilar, DO [facility] 02/19/2023, 8:11 AM

## 2023-02-20 LAB — COMPLETE METABOLIC PANEL WITH GFR
AG Ratio: 1.8 (calc) (ref 1.0–2.5)
ALT: 23 U/L (ref 9–46)
AST: 17 U/L (ref 10–35)
Albumin: 4.5 g/dL (ref 3.6–5.1)
Alkaline phosphatase (APISO): 66 U/L (ref 35–144)
BUN: 20 mg/dL (ref 7–25)
CO2: 27 mmol/L (ref 20–32)
Calcium: 9.7 mg/dL (ref 8.6–10.3)
Chloride: 105 mmol/L (ref 98–110)
Creat: 1.05 mg/dL (ref 0.70–1.30)
Globulin: 2.5 g/dL (ref 1.9–3.7)
Glucose, Bld: 104 mg/dL — ABNORMAL HIGH (ref 65–99)
Potassium: 4.7 mmol/L (ref 3.5–5.3)
Sodium: 140 mmol/L (ref 135–146)
Total Bilirubin: 0.7 mg/dL (ref 0.2–1.2)
Total Protein: 7 g/dL (ref 6.1–8.1)
eGFR: 85 mL/min/{1.73_m2} (ref >=60)

## 2023-02-20 LAB — HEMOGLOBIN A1C
Hgb A1c MFr Bld: 5.1 %{Hb} (ref <5.7)
Mean Plasma Glucose: 100 mg/dL
eAG (mmol/L): 5.5 mmol/L

## 2023-02-20 LAB — CBC WITH DIFFERENTIAL/PLATELET
Absolute Monocytes: 508 {cells}/uL (ref 200–950)
Basophils Absolute: 38 {cells}/uL (ref 0–200)
Basophils Relative: 0.7 %
Eosinophils Absolute: 92 {cells}/uL (ref 15–500)
Eosinophils Relative: 1.7 %
HCT: 48.3 % (ref 38.5–50.0)
Hemoglobin: 15.7 g/dL (ref 13.2–17.1)
Lymphs Abs: 1625 {cells}/uL (ref 850–3900)
MCH: 28.9 pg (ref 27.0–33.0)
MCHC: 32.5 g/dL (ref 32.0–36.0)
MCV: 88.8 fL (ref 80.0–100.0)
MPV: 9.6 fL (ref 7.5–12.5)
Monocytes Relative: 9.4 %
Neutro Abs: 3137 {cells}/uL (ref 1500–7800)
Neutrophils Relative %: 58.1 %
Platelets: 258 10*3/uL (ref 140–400)
RBC: 5.44 10*6/uL (ref 4.20–5.80)
RDW: 12.1 % (ref 11.0–15.0)
Total Lymphocyte: 30.1 %
WBC: 5.4 10*3/uL (ref 3.8–10.8)

## 2023-02-20 LAB — LIPID PANEL
Cholesterol: 138 mg/dL (ref <200)
HDL: 60 mg/dL (ref >=40)
LDL Cholesterol (Calc): 66 mg/dL
Non-HDL Cholesterol (Calc): 78 mg/dL (ref <130)
Total CHOL/HDL Ratio: 2.3 (calc) (ref <5.0)
Triglycerides: 41 mg/dL (ref <150)

## 2023-02-20 LAB — PSA: PSA: 0.55 ng/mL (ref <=4.00)

## 2023-02-20 LAB — TSH: TSH: 1.51 m[IU]/L (ref 0.40–4.50)

## 2023-03-12 ENCOUNTER — Other Ambulatory Visit: Payer: Self-pay | Admitting: *Deleted

## 2023-03-12 MED ORDER — ATORVASTATIN CALCIUM 40 MG PO TABS: 40.0000 mg | ORAL_TABLET | Freq: Every day | ORAL | 3 refills | Status: DC

## 2023-12-05 ENCOUNTER — Encounter (INDEPENDENT_AMBULATORY_CARE_PROVIDER_SITE_OTHER): Payer: Self-pay | Admitting: Family Medicine

## 2023-12-05 DIAGNOSIS — T7840XA Allergy, unspecified, initial encounter: Secondary | ICD-10-CM

## 2023-12-05 DIAGNOSIS — T63461A Toxic effect of venom of wasps, accidental (unintentional), initial encounter: Secondary | ICD-10-CM

## 2023-12-07 MED ORDER — EPINEPHRINE 0.3 MG/0.3ML IJ SOAJ: 0.3000 mg | INTRAMUSCULAR | 0 refills | Status: AC | PRN

## 2023-12-07 NOTE — Telephone Encounter (Signed)
Please see the MyChart message reply(ies) for my assessment and plan.    This patient gave consent for this Medical Advice Message and is aware that it may result in a bill to their insurance company, as well as the possibility of receiving a bill for a co-payment or deductible. They are an established patient, but are not seeking medical advice exclusively about a problem treated during an in person or video visit in the last seven days. I did not recommend an in person or video visit within seven days of my reply.    I spent a total of 5 minutes cumulative time within 7 days through MyChart messaging.  Axelle Szwed, DO   

## 2024-01-27 ENCOUNTER — Ambulatory Visit: Attending: Cardiology | Admitting: Cardiology

## 2024-01-27 ENCOUNTER — Encounter: Payer: Self-pay | Admitting: Cardiology

## 2024-01-27 VITALS — BP 100/70 | HR 64 | Ht 69.0 in | Wt 176.2 lb

## 2024-01-27 DIAGNOSIS — I2510 Atherosclerotic heart disease of native coronary artery without angina pectoris: Secondary | ICD-10-CM

## 2024-01-27 DIAGNOSIS — E785 Hyperlipidemia, unspecified: Secondary | ICD-10-CM | POA: Diagnosis not present

## 2024-01-27 MED ORDER — NITROGLYCERIN 0.4 MG SL SUBL: 0.4000 mg | SUBLINGUAL_TABLET | SUBLINGUAL | 1 refills | Status: AC | PRN

## 2024-01-27 NOTE — Progress Notes (Signed)
 Cardiology Office Note:    Date:  01/27/2024   ID:  John Christensen, DOB 1970-06-20, MRN [id number]  PCP:  Edman Marsa PARAS, DO   [facility] Providers Cardiologist:  Redell Cave, MD     Referring MD: Edman Marsa *   Chief Complaint  Patient presents with   95 MONTH FOLLOW-UP    Pt doing good sometime indigestion happened.(Walking)    History of Present Illness:    John Christensen is a 53 y.o. male with history of CAD (s/p PCI to proximal LAD 01/2021),  presents for follow-up.  Denies chest pain or shortness of breath.  Walks 3 to 4 miles daily with no symptoms.  Compliant with Plavix , Lipitor as prescribed.  Feels well, no cardiac concerns at this time.   Prior notes LHC 01/2021.  90% proximal LAD, no significant left circumflex or RCA disease.  S/p drug-eluting stent to proximal LAD. Echo 01/2021 EF 55 to 60% Cardiac monitor 04/2021, paroxysmal SVT, rare PACs and PVCs.  Patient triggered events associated with PACs.   Past Medical History:  Diagnosis Date   Cardiac dysrhythmia, unspecified    Other malaise and fatigue    Unspecified congenital anomaly of heart     Past Surgical History:  Procedure Laterality Date   COLONOSCOPY WITH PROPOFOL  N/A 01/30/2020   Procedure: COLONOSCOPY WITH PROPOFOL ;  Surgeon: Unk Corinn Skiff, MD;  Location: [facility] ENDOSCOPY;  Service: Gastroenterology;  Laterality: N/A;   CORONARY STENT INTERVENTION N/A 02/12/2021   Procedure: CORONARY STENT INTERVENTION;  Surgeon: Mady Bruckner, MD;  Location: [facility] INVASIVE CV LAB;  Service: Cardiovascular;  Laterality: N/A;   LEFT HEART CATH AND CORONARY ANGIOGRAPHY Left 02/12/2021   Procedure: LEFT HEART CATH AND CORONARY ANGIOGRAPHY;  Surgeon: Mady Bruckner, MD;  Location: [facility] INVASIVE CV LAB;  Service: Cardiovascular;  Laterality: Left;    Current Medications: Current Meds  Medication Sig   acetaminophen  (TYLENOL ) 500 MG tablet Take 500-1,000 mg by mouth every 6  (six) hours as needed for moderate pain.   atorvastatin  (LIPITOR) 40 MG tablet Take 1 tablet (40 mg total) by mouth daily.   clopidogrel  (PLAVIX ) 75 MG tablet TAKE 1 TABLET BY MOUTH EVERY DAY WITH BREAKFAST   EPINEPHrine  (EPIPEN  2-PAK) 0.3 mg/0.3 mL IJ SOAJ injection Inject 0.3 mg into the muscle as needed for anaphylaxis.   terbinafine  (LAMISIL ) 250 MG tablet Take 1 tablet (250 mg total) by mouth daily. For total of 3 months for onychomycosis toenail   [DISCONTINUED] nitroGLYCERIN  (NITROSTAT ) 0.4 MG SL tablet Place 1 tablet (0.4 mg total) under the tongue every 5 (five) minutes as needed for chest pain. For a maximum of 3 doses     Allergies:   Patient has no known allergies.   Social History   Socioeconomic History   Marital status: Married    Spouse name: Not on file   Number of children: Not on file   Years of education: Not on file   Highest education level: Not on file  Occupational History   Not on file  Tobacco Use   Smoking status: Never   Smokeless tobacco: Never  Vaping Use   Vaping status: Never Used  Substance and Sexual Activity   Alcohol use: Yes    Comment: 2 a month, some months none   Drug use: Never   Sexual activity: Yes    Birth control/protection: Condom  Other Topics Concern   Not on file  Social History Narrative   Not on file   Social  Drivers of Corporate investment banker Strain: Not on file  Food Insecurity: Not on file  Transportation Needs: Not on file  Physical Activity: Not on file  Stress: Not on file  Social Connections: Not on file     Family History: The patient's family history includes Hyperlipidemia in his father and mother. There is no history of Colon cancer or Prostate cancer.  ROS:   Please see the history of present illness.     All other systems reviewed and are negative.  EKGs/Labs/Other Studies Reviewed:    The following studies were reviewed today:   EKG Interpretation Date/Time:  Wednesday January 27 2024  08:12:25 EDT Ventricular Rate:  64 PR Interval:  138 QRS Duration:  84 QT Interval:  404 QTC Calculation: 416 R Axis:   26  Text Interpretation: Normal sinus rhythm Normal ECG Confirmed by Darliss Rogue ([id number]) on 01/27/2024 8:28:11 AM    Recent Labs: 02/19/2023: ALT 23; BUN 20; Creat 1.05; Hemoglobin 15.7; Platelets 258; Potassium 4.7; Sodium 140; TSH 1.51  Recent Lipid Panel    Component Value Date/Time   CHOL 138 02/19/2023 0841   CHOL 134 04/29/2021 1439   TRIG 41 02/19/2023 0841   HDL 60 02/19/2023 0841   HDL 55 04/29/2021 1439   CHOLHDL 2.3 02/19/2023 0841   VLDL 9 09/09/2016 0922   LDLCALC 66 02/19/2023 0841   LDLDIRECT 65 04/29/2021 1439     Risk Assessment/Calculations:          Physical Exam:    VS:  BP 100/70 (BP Location: Left Arm, Patient Position: Sitting, Cuff Size: Normal)   Pulse 64   Ht 5' 9 (1.753 m)   Wt 176 lb 3.2 oz (79.9 kg)   SpO2 99%   BMI 26.02 kg/m     Wt Readings from Last 3 Encounters:  01/27/24 176 lb 3.2 oz (79.9 kg)  02/19/23 176 lb (79.8 kg)  01/26/23 175 lb 9.6 oz (79.7 kg)     GEN:  Well nourished, well developed in no acute distress HEENT: Normal NECK: No JVD; No carotid bruits CARDIAC: RRR, no murmurs, rubs, gallops RESPIRATORY:  Clear to auscultation without rales, wheezing or rhonchi  ABDOMEN: Soft, non-tender, non-distended MUSCULOSKELETAL:  No edema; No deformity  SKIN: Warm and dry NEUROLOGIC:  Alert and oriented x 3 PSYCHIATRIC:  Normal affect   ASSESSMENT:    1. Coronary artery disease involving native coronary artery of native heart without angina pectoris   2. Hyperlipidemia LDL goal <70    PLAN:    In order of problems listed above:  CAD s/p DES to proximal LAD 01/2021.  Echo 9/22 EF 55 to 60%.  Denies chest pain.  Continue Plavix , Lipitor 40 mg daily. Hyperlipidemia, LDL at goal.  Continue Lipitor 40 mg daily.  Follow-up in 1 year   Medication Adjustments/Labs and Tests Ordered: Current  medicines are reviewed at length with the patient today.  Concerns regarding medicines are outlined above.  Orders Placed This Encounter  Procedures   EKG 12-Lead     Meds ordered this encounter  Medications   nitroGLYCERIN  (NITROSTAT ) 0.4 MG SL tablet    Sig: Place 1 tablet (0.4 mg total) under the tongue every 5 (five) minutes as needed for chest pain. For a maximum of 3 doses    Dispense:  25 tablet    Refill:  1      Patient Instructions  Medication Instructions:  Your physician recommends that you continue on your current medications as  directed. Please refer to the Current Medication list given to you today.   *If you need a refill on your cardiac medications before your next appointment, please call your pharmacy*  Lab Work: No labs ordered today  If you have labs (blood work) drawn today and your tests are completely normal, you will receive your results only by: MyChart Message (if you have MyChart) OR A paper copy in the mail If you have any lab test that is abnormal or we need to change your treatment, we will call you to review the results.  Testing/Procedures: No test ordered today   Follow-Up: At [facility], you and your health needs are our priority.  As part of our continuing mission to provide you with exceptional heart care, our providers are all part of one team.  This team includes your primary Cardiologist (physician) and Advanced Practice Providers or APPs (Physician Assistants and Nurse Practitioners) who all work together to provide you with the care you need, when you need it.  Your next appointment:   1 year(s)  Provider:   You may see Redell Cave, MD or one of the following Advanced Practice Providers on your designated Care Team:   Lonni Meager, NP Lesley Maffucci, PA-C Bernardino Bring, PA-C Cadence Congers, PA-C Tylene Lunch, NP Barnie Hila, NP    We recommend signing up for the patient portal called MyChart.  Sign up  information is provided on this After Visit Summary.  MyChart is used to connect with patients for Virtual Visits (Telemedicine).  Patients are able to view lab/test results, encounter notes, upcoming appointments, etc.  Non-urgent messages can be sent to your provider as well.   To learn more about what you can do with MyChart, go to [email or web address].          Signed, Redell Cave, MD  01/27/2024 8:45 AM    [facility]

## 2024-01-27 NOTE — Patient Instructions (Signed)

## 2024-02-19 ENCOUNTER — Encounter: Payer: BC Managed Care – PPO | Admitting: Family Medicine

## 2024-02-25 ENCOUNTER — Other Ambulatory Visit: Payer: Self-pay | Admitting: Physician Assistant

## 2024-02-26 ENCOUNTER — Encounter: Payer: Self-pay | Admitting: Family Medicine

## 2024-03-07 ENCOUNTER — Encounter: Payer: Self-pay | Admitting: Family Medicine

## 2024-03-07 ENCOUNTER — Ambulatory Visit (INDEPENDENT_AMBULATORY_CARE_PROVIDER_SITE_OTHER): Admitting: Family Medicine

## 2024-03-07 VITALS — BP 122/76 | HR 70 | Ht 69.0 in | Wt 181.5 lb

## 2024-03-07 DIAGNOSIS — E785 Hyperlipidemia, unspecified: Secondary | ICD-10-CM

## 2024-03-07 DIAGNOSIS — Z125 Encounter for screening for malignant neoplasm of prostate: Secondary | ICD-10-CM | POA: Diagnosis not present

## 2024-03-07 DIAGNOSIS — T7840XD Allergy, unspecified, subsequent encounter: Secondary | ICD-10-CM

## 2024-03-07 DIAGNOSIS — Z131 Encounter for screening for diabetes mellitus: Secondary | ICD-10-CM

## 2024-03-07 DIAGNOSIS — T63461S Toxic effect of venom of wasps, accidental (unintentional), sequela: Secondary | ICD-10-CM

## 2024-03-07 DIAGNOSIS — Z0000 Encounter for general adult medical examination without abnormal findings: Secondary | ICD-10-CM | POA: Diagnosis not present

## 2024-03-07 DIAGNOSIS — I2510 Atherosclerotic heart disease of native coronary artery without angina pectoris: Secondary | ICD-10-CM | POA: Diagnosis not present

## 2024-03-07 MED ORDER — NEFFY 2 MG/0.1ML NA SOLN: 2.0000 mg | Freq: Once | NASAL | 1 refills | Status: AC

## 2024-03-07 NOTE — Progress Notes (Signed)
 Subjective:    Patient ID: John Christensen, male    DOB: [birth year], 53 y.o.   MRN: [id number]  John Christensen is a 53 y.o. male presenting on 03/07/2024 for Annual Exam   HPI  Discussed the use of AI scribe software for clinical note transcription with the patient, who gave verbal consent to proceed.  History of Present Illness   John Christensen is a 53 year old male who presents for an annual physical exam.  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 Currently very mild frequency see above. No known family history of prostate CA. Due for screening.   Colon CA Screening: Last colonoscopy 01/30/20 Dr Unk no polyps, repeat 10 years approx 2031. Currently asymptomatic. No known family history of colon CA.      03/07/2024    8:10 AM 02/19/2023    8:03 AM 01/29/2022    8:47 AM  Depression screen PHQ 2/9  Decreased Interest 0 0 0  Down, Depressed, Hopeless 0 0 0  PHQ - 2 Score 0 0 0   Altered sleeping  0 0  Tired, decreased energy  0 0  Change in appetite  0 0  Feeling bad or failure about yourself   0 0  Trouble concentrating  0 0  Moving slowly or fidgety/restless  0 0  Suicidal thoughts  0 0  PHQ-9 Score  0 0  Difficult doing work/chores   Not difficult at all       03/07/2024    8:10 AM 02/19/2023    8:04 AM 01/29/2022    8:47 AM 12/31/2020    8:05 AM  GAD 7 : Generalized Anxiety Score  Nervous, Anxious, on Edge 0 0 0 0  Control/stop worrying 0 0 0 0  Worry too much - different things 0 0 0 0  Trouble relaxing 0 0 0 0  Restless 0 0 0 0  Easily annoyed or irritable 0 0 0 0  Afraid - awful might happen 0 0 0 0  Total GAD 7 Score 0 0 0 0  Anxiety Difficulty   Not difficult at all Not difficult at all     Past Medical History:  Diagnosis Date   Cardiac dysrhythmia, unspecified    Other malaise and fatigue    Unspecified congenital anomaly of heart    Past Surgical History:  Procedure Laterality Date   COLONOSCOPY WITH PROPOFOL  N/A 01/30/2020   Procedure: COLONOSCOPY WITH PROPOFOL ;  Surgeon: Unk Corinn Skiff, MD;  Location: [facility] ENDOSCOPY;  Service: Gastroenterology;  Laterality: N/A;   CORONARY STENT INTERVENTION N/A 02/12/2021   Procedure: CORONARY STENT INTERVENTION;  Surgeon: Mady Bruckner, MD;  Location: [facility] INVASIVE CV LAB;  Service: Cardiovascular;  Laterality: N/A;   LEFT HEART CATH AND CORONARY ANGIOGRAPHY Left 02/12/2021   Procedure: LEFT HEART CATH AND CORONARY ANGIOGRAPHY;  Surgeon: Mady Bruckner, MD;  Location: [facility] INVASIVE CV LAB;  Service: Cardiovascular;  Laterality: Left;   Social History   Socioeconomic History   Marital status: Married    Spouse name: Not on file   Number of children: Not on file   Years of education: Not on file   Highest education level: Some college, no degree  Occupational History   Not on file  Tobacco Use   Smoking status: Never   Smokeless tobacco: Never  Vaping Use   Vaping status:  Never Used  Substance and Sexual Activity   Alcohol use: Yes    Comment: 2 a month, some months none   Drug use: Never   Sexual activity: Yes    Birth control/protection: Condom  Other Topics Concern   Not on file  Social History Narrative   Not on file   Social Drivers of Health   Financial Resource Strain: Low Risk  (03/06/2024)   Overall Financial Resource Strain (CARDIA)    Difficulty of Paying Living Expenses: Not hard at all  Food Insecurity: No Food Insecurity (03/06/2024)   Hunger Vital Sign    Worried About Running Out of Food in the Last Year: Never true    Ran Out of Food in the Last Year: Never true  Transportation Needs: No Transportation Needs (03/06/2024)   PRAPARE - Administrator, Civil Service (Medical): No    Lack of Transportation (Non-Medical): No  Physical Activity: Sufficiently Active (03/06/2024)   Exercise Vital Sign    Days of Exercise per Week: 6 days    Minutes of Exercise per Session: 60 min  Stress: No Stress Concern Present (03/06/2024)   [employer] of Occupational Health - Occupational Stress Questionnaire    Feeling of Stress: Not at all  Social Connections: Socially Isolated (03/06/2024)   Social Connection and Isolation Panel    Frequency of Communication with Friends and Family: Once a week    Frequency of Social Gatherings with Friends and Family: Never    Attends Religious Services: Never    Database administrator or Organizations: No    Attends Engineer, structural: Not on file    Marital Status: Married  Catering manager Violence: Not on file   Family History  Problem Relation Age of Onset   Hyperlipidemia Mother    Hyperlipidemia Father    Colon cancer Neg Hx    Prostate cancer Neg Hx    Current Outpatient Medications on File Prior to Visit  Medication Sig   acetaminophen  (TYLENOL ) 500 MG tablet Take 500-1,000 mg by mouth every 6 (six) hours as needed for moderate pain.   atorvastatin  (LIPITOR) 40  MG tablet Take 1 tablet (40 mg total) by mouth daily.   clopidogrel  (PLAVIX ) 75 MG tablet TAKE 1 TABLET BY MOUTH EVERY DAY WITH BREAKFAST   EPINEPHrine  (EPIPEN  2-PAK) 0.3 mg/0.3 mL IJ SOAJ injection Inject 0.3 mg into the muscle as needed for anaphylaxis.   nitroGLYCERIN  (NITROSTAT ) 0.4 MG SL tablet Place 1 tablet (0.4 mg total) under the tongue every 5 (five) minutes as needed for chest pain. For a maximum of 3 doses   terbinafine  (LAMISIL ) 250 MG tablet Take  1 tablet (250 mg total) by mouth daily. For total of 3 months for onychomycosis toenail   No current facility-administered medications on file prior to visit.    Review of Systems  Constitutional:  Negative for activity change, appetite change, chills, diaphoresis, fatigue and fever.  HENT:  Negative for congestion and hearing loss.   Eyes:  Negative for visual disturbance.  Respiratory:  Negative for cough, chest tightness, shortness of breath and wheezing.   Cardiovascular:  Negative for chest pain, palpitations and leg swelling.  Gastrointestinal:  Negative for abdominal pain, constipation, diarrhea, nausea and vomiting.  Genitourinary:  Negative for dysuria, frequency and hematuria.  Musculoskeletal:  Negative for arthralgias and neck pain.  Skin:  Negative for rash.  Neurological:  Negative for dizziness, weakness, light-headedness, numbness and headaches.  Hematological:  Negative for adenopathy.  Psychiatric/Behavioral:  Negative for behavioral problems, dysphoric mood and sleep disturbance.    Per HPI unless specifically indicated above     Objective:    BP 122/76 (BP Location: Right Arm, Patient Position: Sitting, Cuff Size: Normal)   Pulse 70   Ht 5' 9 (1.753 m)   Wt 181 lb 8 oz (82.3 kg)   SpO2 96%   BMI 26.80 kg/m   Wt Readings from Last 3 Encounters:  03/07/24 181 lb 8 oz (82.3 kg)  01/27/24 176 lb 3.2 oz (79.9 kg)  02/19/23 176 lb (79.8 kg)    Physical Exam Vitals and nursing note reviewed.   Constitutional:      General: He is not in acute distress.    Appearance: He is well-developed. He is not diaphoretic.     Comments: Well-appearing, comfortable, cooperative  HENT:     Head: Normocephalic and atraumatic.     Right Ear: Tympanic membrane, ear canal and external ear normal. There is no impacted cerumen.     Left Ear: Tympanic membrane, ear canal and external ear normal. There is no impacted cerumen.  Eyes:     General:        Right eye: No discharge.        Left eye: No discharge.     Conjunctiva/sclera: Conjunctivae normal.     Pupils: Pupils are equal, round, and reactive to light.  Neck:     Thyroid: No thyromegaly.     Vascular: No carotid bruit.  Cardiovascular:     Rate and Rhythm: Normal rate and regular rhythm.     Pulses: Normal pulses.     Heart sounds: Normal heart sounds. No murmur heard. Pulmonary:     Effort: Pulmonary effort is normal. No respiratory distress.     Breath sounds: Normal breath sounds. No wheezing or rales.  Abdominal:     General: Bowel sounds are normal. There is no distension.     Palpations: Abdomen is soft. There is no mass.     Tenderness: There is no abdominal tenderness.  Musculoskeletal:        General: No tenderness. Normal range of motion.     Cervical back: Normal range of motion and neck supple.     Right lower leg: No edema.     Left lower leg: No edema.     Comments: Upper / Lower Extremities: - Normal muscle tone, strength bilateral upper extremities 5/5, lower extremities 5/5  Lymphadenopathy:     Cervical: No cervical adenopathy.  Skin:    General: Skin is warm and dry.     Findings: No erythema or rash.  Neurological:  Mental Status: He is alert and oriented to person, place, and time.     Comments: Distal sensation intact to light touch all extremities  Psychiatric:        Mood and Affect: Mood normal.        Behavior: Behavior normal.        Thought Content: Thought content normal.     Comments: Well  groomed, good eye contact, normal speech and thoughts     Results for orders placed or performed in visit on 02/19/23  ECHOCARDIOGRAM COMPLETE   Collection Time: 02/19/23  9:49 AM  Result Value Ref Range   Weight 2,816 oz   Height 69 in   BP 112/66 mmHg   S' Lateral 4.00 cm   Area-P 1/2 3.31 cm2   Est EF 50 - 55%       Assessment & Plan:   Problem List Items Addressed This Visit     CAD in native artery   Relevant Medications   NEFFY  2 MG/0.1ML SOLN   Other Relevant Orders   Lipid panel   Comprehensive metabolic panel with GFR   Hyperlipidemia LDL goal <70   Relevant Medications   NEFFY  2 MG/0.1ML SOLN   Other Relevant Orders   Lipid panel   TSH   Comprehensive metabolic panel with GFR   Other Visit Diagnoses       Annual physical exam    -  Primary   Relevant Orders   Lipid panel   Hemoglobin A1c   CBC with Differential/Platelet   PSA   TSH   Comprehensive metabolic panel with GFR     Screening for prostate cancer       Relevant Orders   PSA     Screening for diabetes mellitus (DM)       Relevant Orders   Hemoglobin A1c     Wasp sting, accidental or unintentional, sequela       Relevant Medications   NEFFY  2 MG/0.1ML SOLN   Other Relevant Orders   Allergen Hymenoptera Panel     Allergic reaction, subsequent encounter       Relevant Medications   NEFFY  2 MG/0.1ML SOLN   Other Relevant Orders   Allergen Hymenoptera Panel        Updated Health Maintenance information Fasting lab panel today, pending results Encouraged improvement to lifestyle with diet and exercise Goal of weight loss  History 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 He has Epi Pen but asking about intranasal epinephrine . Discussed Neffy  nasal spray for convenience and stability.  Order stinging venom allergy panel. - Prescribe Neffy  nasal spray 2 mg for emergency use. - Advise to keep EpiPens as backup. - Discuss  Neffy  cost and suggest checking for coupons.  Coronary artery disease, status post LAD stent 2022 Followed by Cardiology Coronary artery disease with LAD stent placed in September 2022. Reviewed prior imaging CT, ECHO - Continue Plavix  and Lipitor. - Coordinate with cardiologist for medication refills.  Likely Benign prostatic hyperplasia with lower urinary tract symptoms Urinary frequency likely due to benign prostatic hyperplasia. Discussed saw palmetto as initial treatment option. - Recommend trial of saw palmetto 80-160 mg twice daily.  Adult Wellness Visit Annual wellness visit conducted. Discussed general health maintenance, including vaccinations and screenings. - Perform routine blood work. - Schedule next annual wellness visit for early Monday morning next year. - Check blood for prostate-specific antigen (PSA).  - He declines vaccines, discussed  Orders Placed This Encounter  Procedures   Lipid panel    Has the patient fasted?:   Yes   Hemoglobin A1c   CBC with Differential/Platelet   PSA   TSH   Comprehensive metabolic panel with GFR    Has the patient fasted?:   Yes   Allergen Hymenoptera Panel    Meds ordered this encounter  Medications   NEFFY  2 MG/0.1ML SOLN    Sig: Place 2 mg into the nose once for 1 dose. If response is inadequate, administer a second dose in the same nostril 5 minutes after the first dose.    Dispense:  2 each    Refill:  1     Follow up plan: Return for 1 year Annual Physical, AM fasting labs AFTER.   Marsa Officer, DO [facility] 03/07/2024, 8:31 AM

## 2024-03-07 NOTE — Patient Instructions (Addendum)
 Thank you for coming to the office today.  Prevnar-20 future consideration.  Hepatitis B vaccine booster or check blood for titer for immunity.  Cholesterol med needs refill later this year from Cardiology  Bee/Wasp Stinging venom lab panel, stay tuned  Tried to order Neffy  but not sure if it is covered.  Saw Palmetto for herbal therapy for prostate 80 to 160mg  twice a day for urination improvement.   DUE for FASTING BLOOD WORK (no food or drink after midnight before the lab appointment, only water or coffee without cream/sugar on the morning of)  SCHEDULE Lab Only visit in the morning at the clinic for lab draw in 1 YEAR  - Make sure Lab Only appointment is at about 1 week before your next appointment, so that results will be available  For Lab Results, once available within 2-3 days of blood draw, you can can log in to MyChart online to view your results and a brief explanation. Also, we can discuss results at next follow-up visit.    Please schedule a Follow-up Appointment to: Return for 1 year fasting lab > 1 week later Annual Physical.  If you have any other questions or concerns, please feel free to call the office or send a message through MyChart. You may also schedule an earlier appointment if necessary.  Additionally, you may be receiving a survey about your experience at our office within a few days to 1 week by e-mail or mail. We value your feedback.  Marsa Officer, DO [facility], [place]

## 2024-03-08 LAB — COMPREHENSIVE METABOLIC PANEL WITH GFR
AG Ratio: 1.7 (calc) (ref 1.0–2.5)
ALT: 29 U/L (ref 9–46)
AST: 20 U/L (ref 10–35)
Albumin: 4.5 g/dL (ref 3.6–5.1)
Alkaline phosphatase (APISO): 56 U/L (ref 35–144)
BUN: 13 mg/dL (ref 7–25)
CO2: 29 mmol/L (ref 20–32)
Calcium: 9.4 mg/dL (ref 8.6–10.3)
Chloride: 105 mmol/L (ref 98–110)
Creat: 1.05 mg/dL (ref 0.70–1.30)
Globulin: 2.7 g/dL (ref 1.9–3.7)
Glucose, Bld: 102 mg/dL — ABNORMAL HIGH (ref 65–99)
Potassium: 4.4 mmol/L (ref 3.5–5.3)
Sodium: 141 mmol/L (ref 135–146)
Total Bilirubin: 0.8 mg/dL (ref 0.2–1.2)
Total Protein: 7.2 g/dL (ref 6.1–8.1)
eGFR: 84 mL/min/1.73m2 (ref >=60)

## 2024-03-08 LAB — CBC WITH DIFFERENTIAL/PLATELET
Absolute Lymphocytes: 1581 {cells}/uL (ref 850–3900)
Absolute Monocytes: 498 {cells}/uL (ref 200–950)
Basophils Absolute: 69 {cells}/uL (ref 0–200)
Basophils Relative: 1.1 %
Eosinophils Absolute: 88 {cells}/uL (ref 15–500)
Eosinophils Relative: 1.4 %
HCT: 46 % (ref 38.5–50.0)
Hemoglobin: 15.8 g/dL (ref 13.2–17.1)
MCH: 29.9 pg (ref 27.0–33.0)
MCHC: 34.3 g/dL (ref 32.0–36.0)
MCV: 87 fL (ref 80.0–100.0)
MPV: 9.5 fL (ref 7.5–12.5)
Monocytes Relative: 7.9 %
Neutro Abs: 4064 {cells}/uL (ref 1500–7800)
Neutrophils Relative %: 64.5 %
Platelets: 266 Thousand/uL (ref 140–400)
RBC: 5.29 Million/uL (ref 4.20–5.80)
RDW: 12.1 % (ref 11.0–15.0)
Total Lymphocyte: 25.1 %
WBC: 6.3 Thousand/uL (ref 3.8–10.8)

## 2024-03-08 LAB — ALLERGEN HYMENOPTERA PANEL
CLASS: 2
CLASS: 2
CLASS: 2
CLASS: 3
Honey Bee IgE: 0.17 kU/L — ABNORMAL HIGH
Paper Wasp IgE: 2.33 kU/L — ABNORMAL HIGH
White Hornet IgE: 4.19 kU/L — ABNORMAL HIGH
Yellow Hornet IgE: 1.69 kU/L — ABNORMAL HIGH
Yellow Jacket IgE: 2.66 kU/L — ABNORMAL HIGH

## 2024-03-08 LAB — TSH: TSH: 1.68 m[IU]/L (ref 0.40–4.50)

## 2024-03-08 LAB — LIPID PANEL
Cholesterol: 130 mg/dL (ref <200)
HDL: 61 mg/dL (ref >=40)
LDL Cholesterol (Calc): 55 mg/dL
Non-HDL Cholesterol (Calc): 69 mg/dL (ref <130)
Total CHOL/HDL Ratio: 2.1 (calc) (ref <5.0)
Triglycerides: 49 mg/dL (ref <150)

## 2024-03-08 LAB — HEMOGLOBIN A1C
Hgb A1c MFr Bld: 5 % (ref <5.7)
Mean Plasma Glucose: 97 mg/dL
eAG (mmol/L): 5.4 mmol/L

## 2024-03-08 LAB — INTERPRETATION:

## 2024-03-08 LAB — PSA: PSA: 0.46 ng/mL (ref <=4.00)

## 2024-03-09 ENCOUNTER — Ambulatory Visit: Payer: Self-pay | Admitting: Family Medicine

## 2024-04-01 ENCOUNTER — Other Ambulatory Visit: Payer: Self-pay | Admitting: Cardiology

## 2025-03-13 ENCOUNTER — Encounter: Admitting: Family Medicine
